# Patient Record
Sex: Male | Born: 1985 | Hispanic: Refuse to answer | Marital: Single | Smoking: Current every day smoker
Health system: Southern US, Community
[De-identification: ages and names within clinical notes are randomized; demographics above are authoritative.]

## PROBLEM LIST (undated history)

## (undated) DIAGNOSIS — F1910 Other psychoactive substance abuse, uncomplicated: Secondary | ICD-10-CM

## (undated) DIAGNOSIS — J45909 Unspecified asthma, uncomplicated: Secondary | ICD-10-CM

## (undated) DIAGNOSIS — K219 Gastro-esophageal reflux disease without esophagitis: Secondary | ICD-10-CM

## (undated) HISTORY — PX: COCCYX REMOVAL: SHX600

## (undated) HISTORY — DX: Other psychoactive substance abuse, uncomplicated: F19.10

## (undated) HISTORY — PX: HIP CLOSED REDUCTION: SHX983

## (undated) HISTORY — PX: JOINT REPLACEMENT: SHX530

## (undated) NOTE — Progress Notes (Signed)
 Formatting of this note is different from the original. Images from the original note were not included.  ____________________________________ [facility] General Surgery  Clinic Note:  Subjective: [name] is a 43 y.o. Caucasian male who underwent an open incisional ventral hernia repair (primary) with Dr. Mellody on 10/27/22.  He states he has been doing very well since surgery. Tolerating a regular diet, no nausea or vomiting. Having regular bowel movements. Incision is healing very well. Denies pain.     Objective: Vitals:   11/24/22 1519  BP: 116/82  Pulse: 84  Temp: 98.4 F (36.9 C)  SpO2: 97%  PainSc: 0-No pain   Gen:  NAD CV:    RRR Resp: CTAB Abd:  S/NT/ND. Supraumbilical incision healing well, glue has peeled off   Labs:  -  Imaging: -  Pathology: -  Assessment: [name] is a 48 y.o. Caucasian male s/p open primary repair of incisional ventral hernia, who is currently doing well.  Plan: 1. Continue current post-op management - doing well, recovering as expected. 2. No strenuous activity or lifting > 20 lbs x 6 weeks post-operatively. Patient verbalized understanding of activity restriction. 3. Patient has requested a letter stating he may do light duty work at [employer] to avoid any heavy lifting for the remaining 5 weeks he is a patient there. 4. The patient should return to clinic prn.  The case to be discussed with Dr. Mellody who will make adjustments to the above assessment and plan as deemed necessary in an addendum.  Jordan Lynn Sampogna, FNP  ____________________________________ 11/25/2022 09:38 Cosigned by Philippe Mellody, MD at 11/25/2022 10:26 PM EDT Electronically signed by Jordan Lynn Sampogna, FNP at 11/25/2022  9:53 AM EDT Electronically signed by Philippe Mellody, MD at 11/25/2022 10:26 PM EDT  Associated attestation - Mellody Philippe, MD - 11/25/2022 10:26 PM EDT Formatting of this note might be  different from the original. Patient care provided under my supervision. I reviewed all pertinent clinical information. I discussed the findings, assessment and plan with the resident physician or APP at the time of documented encounter and agree with the proposed management/treatment plans. My countersignature will demonstrate my concurrence with that documentation.  Philippe Mellody MD GI Surgery

---

## 2006-08-01 ENCOUNTER — Emergency Department: Payer: Self-pay | Admitting: Emergency Medicine

## 2008-02-11 ENCOUNTER — Emergency Department: Payer: Self-pay | Admitting: Emergency Medicine

## 2008-02-12 ENCOUNTER — Other Ambulatory Visit: Payer: Self-pay

## 2008-02-12 ENCOUNTER — Emergency Department: Payer: Self-pay | Admitting: Emergency Medicine

## 2008-05-11 ENCOUNTER — Emergency Department: Payer: Self-pay | Admitting: Emergency Medicine

## 2008-05-13 ENCOUNTER — Ambulatory Visit: Payer: Self-pay | Admitting: Infectious Diseases

## 2008-05-13 ENCOUNTER — Inpatient Hospital Stay (HOSPITAL_COMMUNITY): Admission: EM | Admit: 2008-05-13 | Discharge: 2008-05-15 | Payer: Self-pay | Admitting: Emergency Medicine

## 2008-09-15 ENCOUNTER — Emergency Department: Payer: Self-pay | Admitting: Emergency Medicine

## 2008-10-15 ENCOUNTER — Inpatient Hospital Stay: Payer: Self-pay | Admitting: Psychiatry

## 2008-11-24 ENCOUNTER — Emergency Department: Payer: Self-pay | Admitting: Emergency Medicine

## 2008-12-18 ENCOUNTER — Emergency Department: Payer: Self-pay | Admitting: Emergency Medicine

## 2008-12-19 ENCOUNTER — Emergency Department: Payer: Self-pay | Admitting: Emergency Medicine

## 2009-01-05 ENCOUNTER — Emergency Department: Payer: Self-pay

## 2009-02-21 ENCOUNTER — Emergency Department: Payer: Self-pay | Admitting: Emergency Medicine

## 2009-03-21 ENCOUNTER — Emergency Department: Payer: Self-pay | Admitting: Emergency Medicine

## 2009-11-02 ENCOUNTER — Emergency Department: Payer: Self-pay | Admitting: Internal Medicine
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
## 2010-12-21 NOTE — Discharge Summary (Signed)
[name], [name] NO.:  [id number]   MEDICAL RECORD NO.:  [id number]          PATIENT TYPE:  INP   LOCATION:  1426                         FACILITY:  [facility]   PHYSICIAN:  Hillery Aldo, M.D.   DATE OF BIRTH:  February 11, 1986   DATE OF ADMISSION:  05/13/2008  DATE OF DISCHARGE:  05/15/2008                               DISCHARGE SUMMARY   PRIMARY CARE PHYSICIAN:  Dr. Vonita Moss at [facility] fax  number [phone].   DISCHARGE DIAGNOSES:  1. Carbamazepine provoked hepatic toxicity.  2. Viral illness.  3. Rash secondary to carbamazepine allergy.  4. Hypokalemia.  5. Hypoproteinemia.  6. Hypoalbuminemia.  7. Transient GI bleed.  8. Bipolar disorder.  9. Thrombocytopenia.   DISCHARGE MEDICATIONS:  Benadryl 25 mg q.4 h p.r.n. pruritus.   CONSULTATIONS:  Dr. Ninetta Lights of Infectious Diseases.   BRIEF ADMISSION HISTORY OF PRESENT ILLNESS:  The patient is 25 year old  male who presented to the emergency department on May 12, 2008 with  multiple complaints including a diffuse itchy macular rash preceded by  pharyngitis, nonproductive cough, and difficulty swallowing.  The  patient subsequently developed a headache, fever, abdominal pain and  then had an episode of coffee-ground emesis accompanied by dark-colored  urine.  He also complained of arthralgias and was subsequently evaluated  at the [facility] where he was noted to have a  fever, elevated liver function studies and a normal CBC.  During that  evaluation, a mono spot test and rapid strep test were negative.  Chest  radiography did not show any evidence of pneumonia.  The patient left  against medical advice and subsequently presented to the emergency  department at [facility] for further evaluation.  She was admitted for  further diagnostic workup.  For the full details, please see the  dictated report done by Dr. Susie Cassette.   PROCEDURES AND DIAGNOSTIC STUDIES:  1.  Abdominal ultrasound on May 12, 2008 showed no acute findings or      focal abnormalities.  Spleen and liver measured at the upper limits      of normal for size.  2. CT scan of the abdomen and pelvis on May 13, 2008, showed      hepatic splenomegaly and periportal edema with some periportal      lymph nodes, all compatible with hepatitis.  There was no evidence      of portal or splenic vein thrombosis.  There was a small amount of      free pelvic fluid.   DISCHARGE LABORATORY VALUES:  Sodium was 136, potassium 3.7, chloride  102, bicarb 30, BUN 2, creatinine 0.87, glucose 106, total bilirubin  3.2, alkaline phosphatase 333, AST 175, ALT 297, total protein 4.3,  albumin 2.4.  White blood cell count was 6.6, hemoglobin 12.6,  hematocrit 36.9, platelets 106.   HOSPITAL COURSE:  1. Carbamazepine provoked hepatic toxicity.  The patient was admitted      and diagnostic workup was undertaken.  Because of concern of a      possible infectious etiology, the infectious disease consultants  were also brought on board.  An extensive diagnostic evaluation      including test for Lyme disease, [facility] spotted fever,      hepatitis C virus, cytomegalovirus, rheumatologic screening, and      toxoplasma studies were all negative.  He had evidence of a prior      exposure to Epstein-Barr virus but no immediate viral illness      related to this.  HIV testing is pending at the time of this      dictation.  Nevertheless, the patient's carbamazepine was      discontinued and his liver function studies have trended down over      the course of his hospital stay.  The patient is therefore stable      for discharge and a follow-up appointment has been made with his      primary care physician with directions to have a follow-up liver      function test done to evaluate for further abnormalities.  It      should be noted that the patient should not ever receive      carbamazepine because  of this adverse drug reaction.  2. Questionable viral illness.  The patient did have some symptoms of      a viral illness with fever and pharyngitis.  This has resolved.  3. Rash secondary #1.  The patient's rash is resolving.  He is      instructed to take Benadryl for pruritus.  This is also      attributable to a carbamazepine induced adverse drug reaction      problem.  4. Hypokalemia:  The patient's potassium was appropriately repleted.  5. Hypoproteinemia, hypoalbuminemia.  These were thought to be      secondary to an inflammatory response from an acute drug reaction.      The patient appears to be in good health and has adequate      nutritional stores otherwise.  6. Transient GI bleed.  This was undocumented in terms of having      guaiac testing or proof of a GI bleed based solely on the patient's      description of his vomitus.  Nevertheless, the patient's hemoglobin      and hematocrit have remained stable throughout his hospital stay.      7.  Bipolar disorder.  The patient's mood is depressed, and he will      need a follow-up appointment made with his psychiatrist for      consideration of further treatment.  7. Thrombocytopenia.  Likely due to hepatosplenomegaly.  He should be      monitored for resolution of this issue.   DISPOSITION:  The patient is medically stable for discharge.  A follow-  up appointment has been made with Dr. Dossie Arbour on May 19, 2008 at  8:15 a.m.Hillery Aldo, M.D.  Electronically Signed     CR/MEDQ  D:  05/15/2008  T:  05/15/2008  Job:  [id number]   cc:   Fax #[phone] Dr. Vonita Moss

## 2011-03-22 IMAGING — CR DG LUMBAR SPINE 2-3V
1 series · 4 of 4 positions shown · non-contrast
Comparison: none

REASON FOR EXAM: pt fell 3 days ago, has low back pain
COMMENTS:

[Series 1: view not recorded · 0.17mm/px · 4 of 4 slices shown]
[im 1/4]
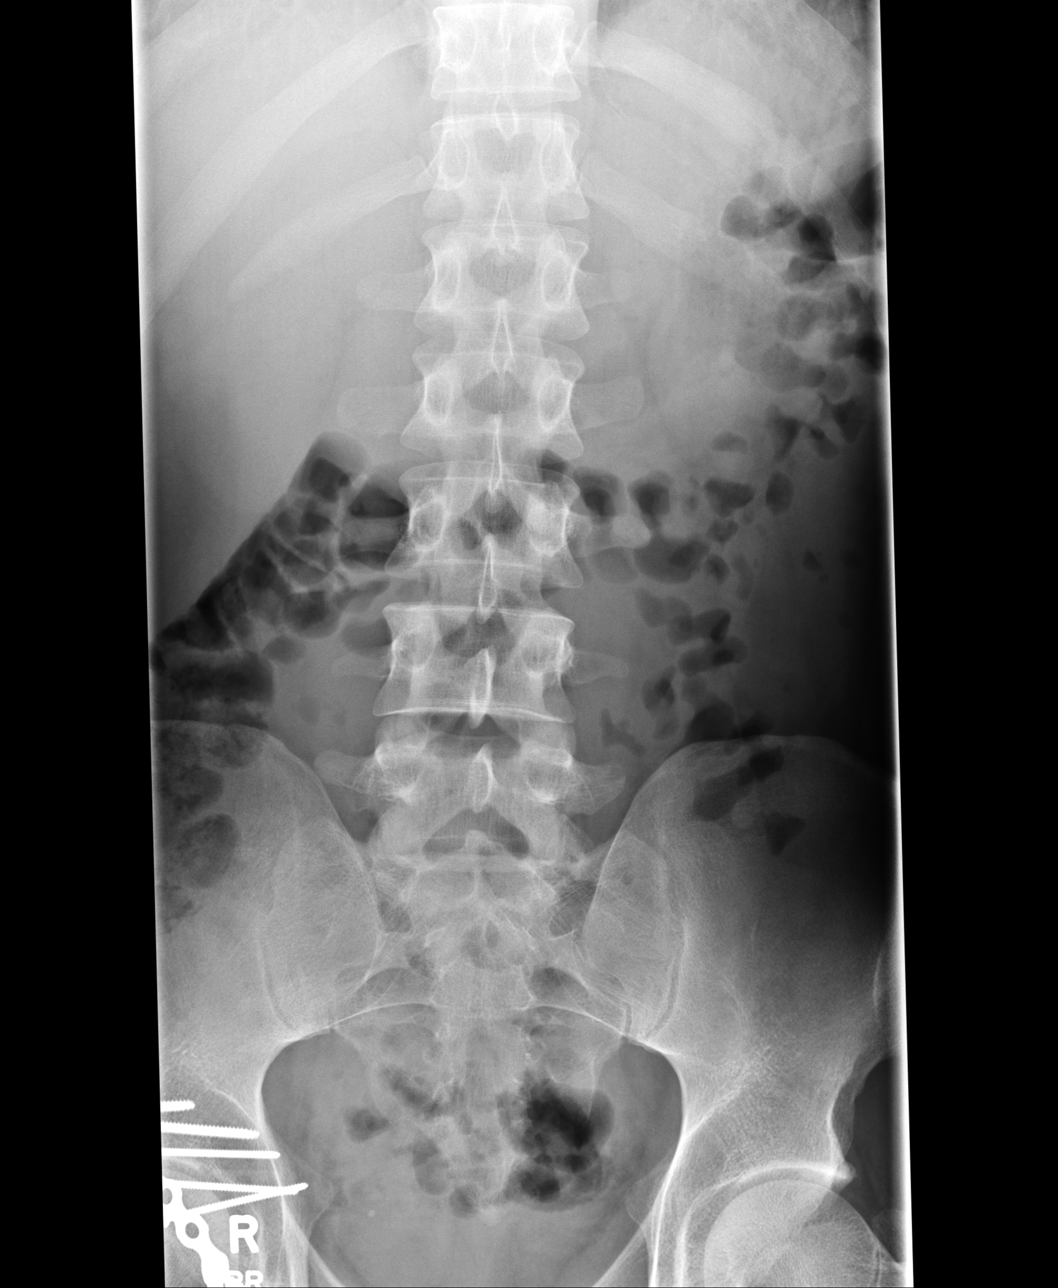
[im 2/4]
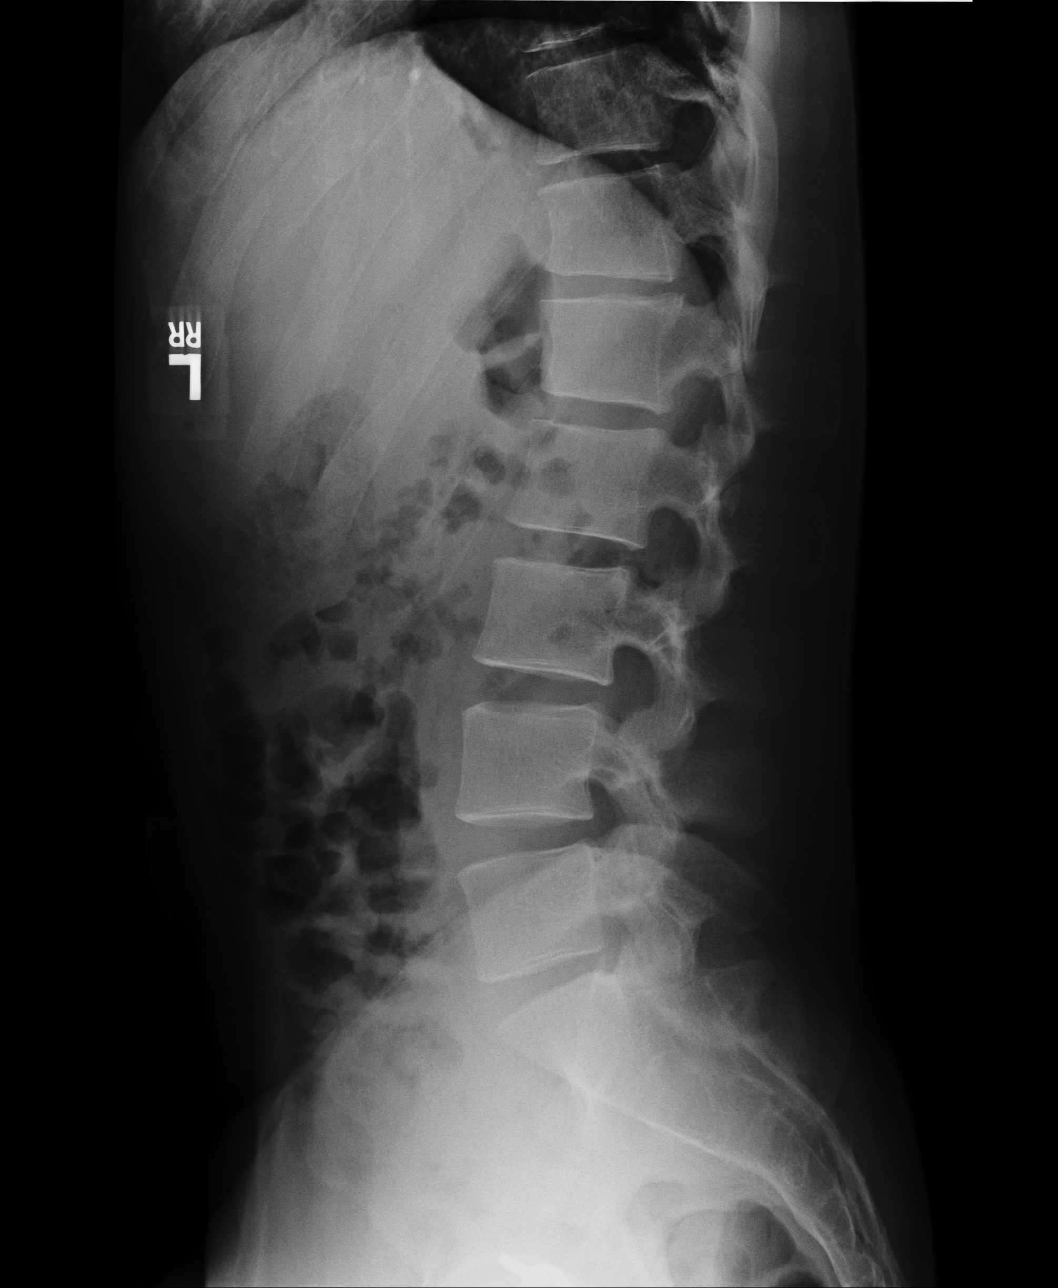
[im 3/4]
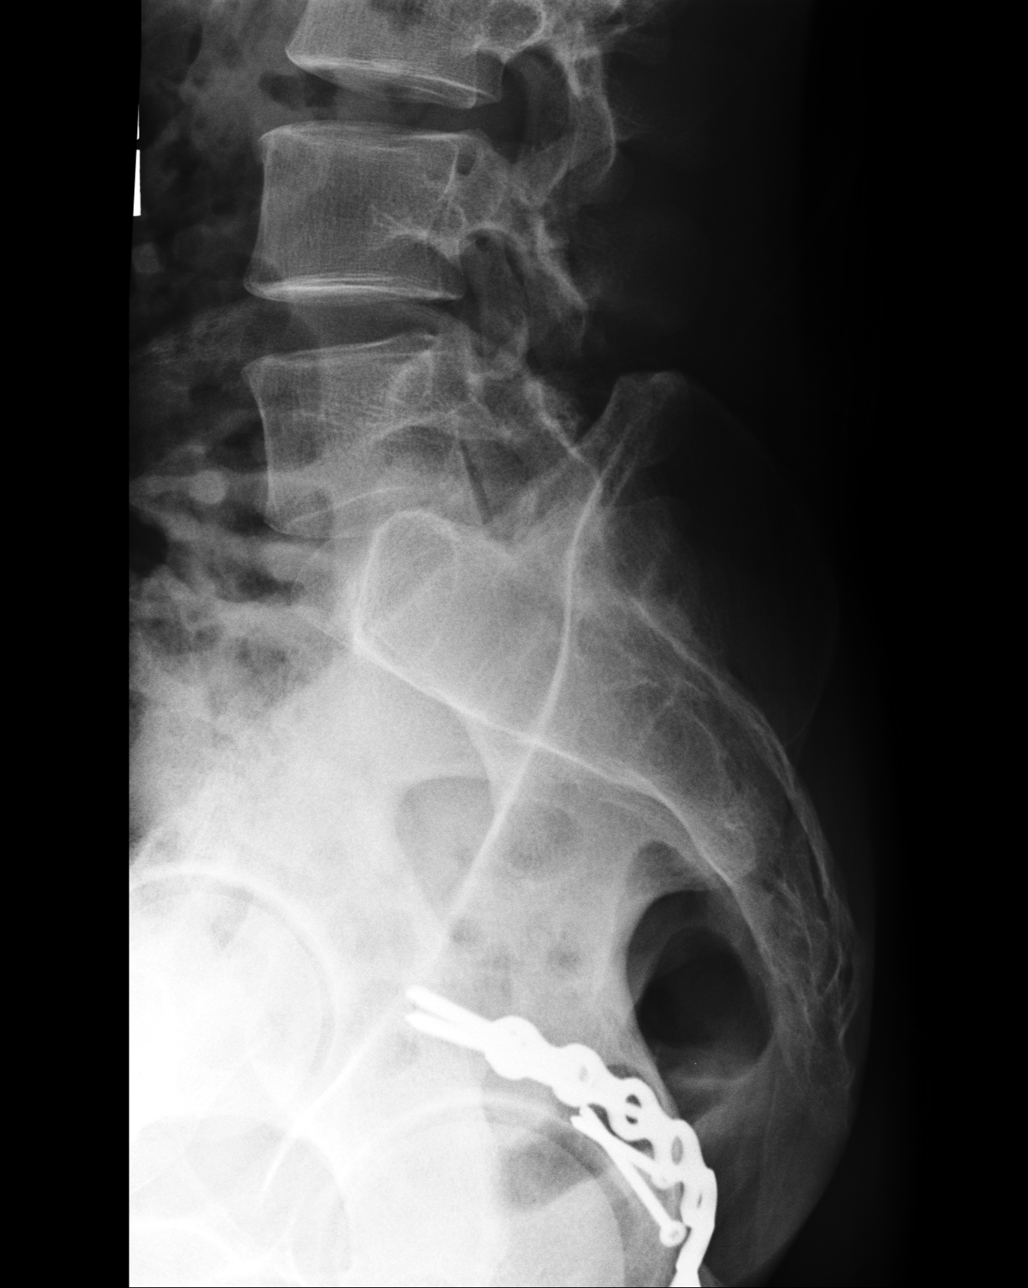
[im 4/4]
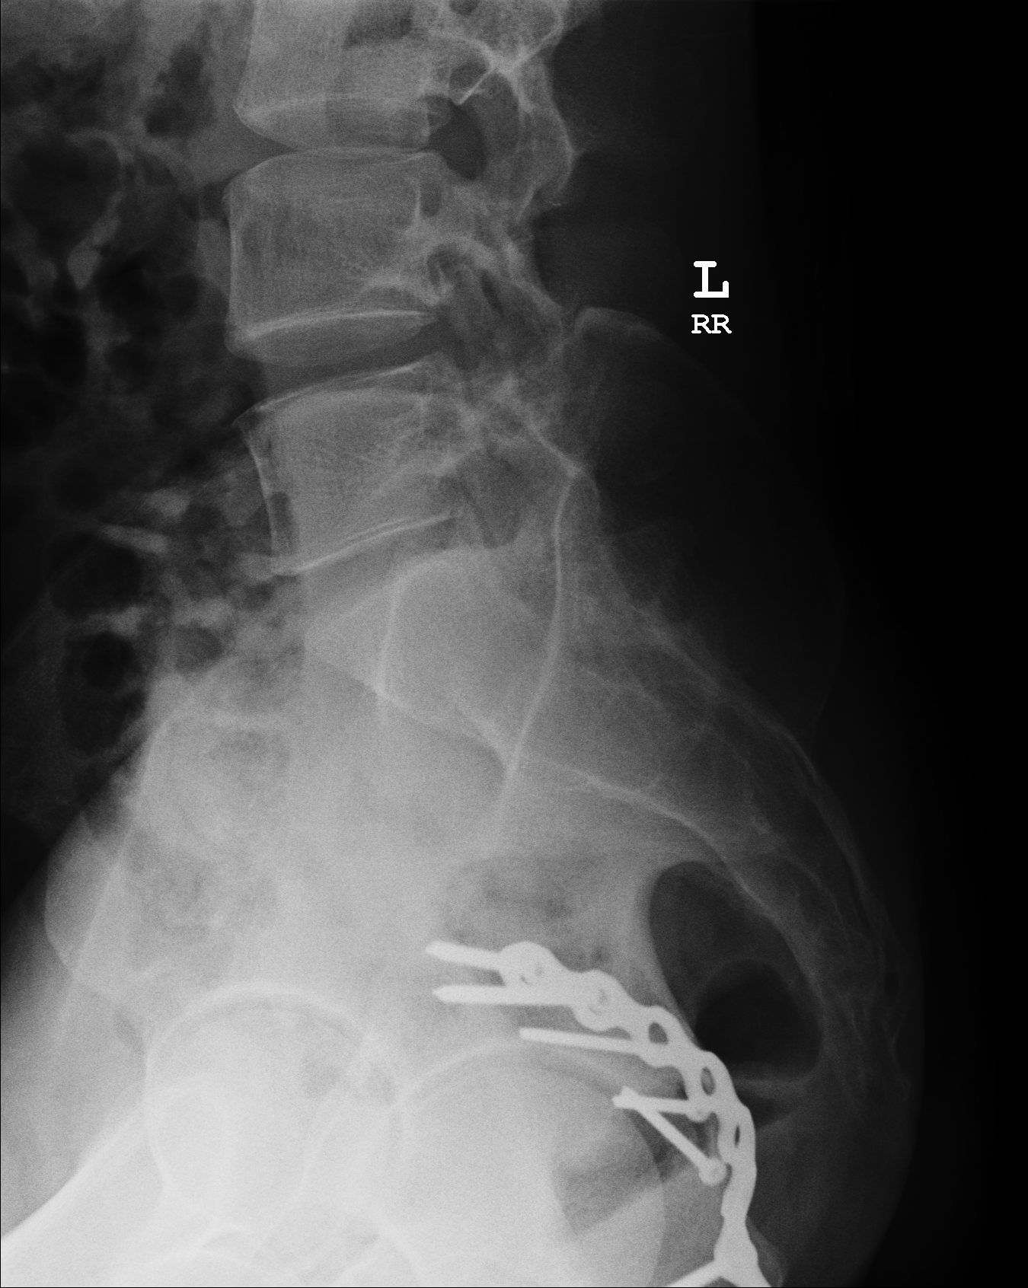

[4 of 4 positions shown; findings below may reference images not displayed]

PROCEDURE:     DXR - DXR LUMBAR SPINE AP AND LATERAL  - February 21, 2009  [DATE]

RESULT:

Five non rib bearing lumbar vertebral bodies are appreciated.  There does
not appear to be evidence of fracture, dislocation or malalignment.  Air is
seen within nondilated loops of bowel.

If there is persistent clinical concern or persistent complaints of pain, a
repeat evaluation in 7-10 days is recommended if clinically warranted.
IMPRESSION: Unremarkable lumbar spine evaluation.

## 2011-05-10 LAB — DIFFERENTIAL
Basophils Absolute: 0.1
Basophils Absolute: 0.1
Basophils Relative: 1
Basophils Relative: 1
Eosinophils Absolute: 0.6
Eosinophils Absolute: 0.6
Eosinophils Relative: 8 — ABNORMAL HIGH
Eosinophils Relative: 8 — ABNORMAL HIGH
Lymphocytes Relative: 42
Lymphocytes Relative: 44
Lymphs Abs: 3.1
Lymphs Abs: 3.5
Monocytes Absolute: 1.1 — ABNORMAL HIGH
Monocytes Absolute: 1.4 — ABNORMAL HIGH
Monocytes Relative: 13 — ABNORMAL HIGH
Monocytes Relative: 18 — ABNORMAL HIGH
Neutro Abs: 2.4
Neutro Abs: 2.8
Neutrophils Relative %: 32 — ABNORMAL LOW
Neutrophils Relative %: 34 — ABNORMAL LOW

## 2011-05-10 LAB — CBC
HCT: 36.9 — ABNORMAL LOW
HCT: 38 — ABNORMAL LOW
HCT: 38.6 — ABNORMAL LOW
HCT: 41
Hemoglobin: 12.6 — ABNORMAL LOW
Hemoglobin: 12.9 — ABNORMAL LOW
Hemoglobin: 13.1
Hemoglobin: 13.8
MCHC: 33.8
MCHC: 33.8
MCHC: 33.9
MCHC: 34.2
MCV: 89.4
MCV: 90.2
MCV: 90.3
MCV: 90.5
Platelets: 106 — ABNORMAL LOW
Platelets: 117 — ABNORMAL LOW
Platelets: 122 — ABNORMAL LOW
Platelets: 141 — ABNORMAL LOW
RBC: 4.13 — ABNORMAL LOW
RBC: 4.21 — ABNORMAL LOW
RBC: 4.27
RBC: 4.54
RDW: 13.4
RDW: 14
RDW: 14
RDW: 14.3
WBC: 6.6
WBC: 6.8
WBC: 7.6
WBC: 8.1

## 2011-05-10 LAB — BASIC METABOLIC PANEL
BUN: 6
CO2: 26
Calcium: 8.1 — ABNORMAL LOW
Chloride: 104
Creatinine, Ser: 0.78
GFR calc Af Amer: >60
GFR calc non Af Amer: >60
Glucose, Bld: 97
Potassium: 3.7
Sodium: 136

## 2011-05-10 LAB — CULTURE, BLOOD (ROUTINE X 2)
Culture: NO GROWTH
Culture: NO GROWTH

## 2011-05-10 LAB — COMPREHENSIVE METABOLIC PANEL
ALT: 297 — ABNORMAL HIGH
ALT: 305 — ABNORMAL HIGH
ALT: 450 — ABNORMAL HIGH
AST: 126 — ABNORMAL HIGH
AST: 175 — ABNORMAL HIGH
AST: 186 — ABNORMAL HIGH
Albumin: 2.4 — ABNORMAL LOW
Albumin: 2.4 — ABNORMAL LOW
Albumin: 3.1 — ABNORMAL LOW
Alkaline Phosphatase: 229 — ABNORMAL HIGH
Alkaline Phosphatase: 241 — ABNORMAL HIGH
Alkaline Phosphatase: 333 — ABNORMAL HIGH
BUN: 2 — ABNORMAL LOW
BUN: 3 — ABNORMAL LOW
BUN: 8
CO2: 27
CO2: 29
CO2: 30
Calcium: 8 — ABNORMAL LOW
Calcium: 8 — ABNORMAL LOW
Calcium: 8.7
Chloride: 100
Chloride: 102
Chloride: 103
Creatinine, Ser: 0.78
Creatinine, Ser: 0.87
Creatinine, Ser: 0.87
GFR calc Af Amer: >60
GFR calc Af Amer: >60
GFR calc Af Amer: >60
GFR calc non Af Amer: >60
GFR calc non Af Amer: >60
GFR calc non Af Amer: >60
Glucose, Bld: 101 — ABNORMAL HIGH
Glucose, Bld: 106 — ABNORMAL HIGH
Glucose, Bld: 80
Potassium: 3.4 — ABNORMAL LOW
Potassium: 3.6
Potassium: 3.7
Sodium: 136
Sodium: 136
Sodium: 137
Total Bilirubin: 3.2 — ABNORMAL HIGH
Total Bilirubin: 3.3 — ABNORMAL HIGH
Total Bilirubin: 4.9 — ABNORMAL HIGH
Total Protein: 4.3 — ABNORMAL LOW
Total Protein: 4.3 — ABNORMAL LOW
Total Protein: 5.3 — ABNORMAL LOW

## 2011-05-10 LAB — URINALYSIS, ROUTINE W REFLEX MICROSCOPIC
Glucose, UA: NEGATIVE
Hgb urine dipstick: NEGATIVE
Ketones, ur: 15 — AB
Nitrite: POSITIVE — AB
Protein, ur: 30 — AB
Specific Gravity, Urine: 1.033 — ABNORMAL HIGH
Urobilinogen, UA: 1
pH: 7.5

## 2011-05-10 LAB — RAPID URINE DRUG SCREEN, HOSP PERFORMED
Amphetamines: NOT DETECTED
Barbiturates: NOT DETECTED
Benzodiazepines: POSITIVE — AB
Cocaine: NOT DETECTED
Opiates: NOT DETECTED
Tetrahydrocannabinol: NOT DETECTED

## 2011-05-10 LAB — HIV 1/2 CONFIRMATION
HIV-1 antibody: NEGATIVE
HIV-2 Ab: NEGATIVE

## 2011-05-10 LAB — PROTIME-INR
INR: 1.1
INR: 1.2
Prothrombin Time: 14.1
Prothrombin Time: 15.1

## 2011-05-10 LAB — MONONUCLEOSIS SCREEN: Mono Screen: NEGATIVE

## 2011-05-10 LAB — HIV-1 RNA, QUALITATIVE, TMA: HIV-1 RNA, Qualitative, TMA: NOT DETECTED

## 2011-05-10 LAB — URINE MICROSCOPIC-ADD ON

## 2011-05-10 LAB — C-REACTIVE PROTEIN: CRP: 2.2 — ABNORMAL HIGH (ref <0.6)

## 2011-05-10 LAB — ANA: Anti Nuclear Antibody(ANA): NEGATIVE

## 2011-05-10 LAB — HCV RNA QUANT

## 2011-05-10 LAB — CYTOMEGALOVIRUS ANTIBODY, IGG: Cytomegalovirus(CMV) Antibody, IgG: NEGATIVE

## 2011-05-10 LAB — CMV ABS, IGG+IGM (CYTOMEGALOVIRUS)
CMV IgM: <8 AU/mL (ref <30.0)
Cytomegalovirus Ab-IgG: <0.2 U/mL (ref <0.6)

## 2011-05-10 LAB — ROCKY MTN SPOTTED FVR AB, IGM-BLOOD: RMSF IgM: 0.19 IV

## 2011-05-10 LAB — TYPE AND SCREEN
ABO/RH(D): O NEG
Antibody Screen: NEGATIVE

## 2011-05-10 LAB — EPSTEIN-BARR VIRUS VCA ANTIBODY PANEL
EBV EA IgG: 0.14
EBV NA IgG: 5.16 — ABNORMAL HIGH
EBV VCA IgG: 5.3 — ABNORMAL HIGH
EBV VCA IgM: 0.03

## 2011-05-10 LAB — TOXOPLASMA ANTIBODIES- IGG AND  IGM
Toxoplasma Antibody- IgM: 0.18 IV
Toxoplasma IgG Ratio: <0.5 IU/mL

## 2011-05-10 LAB — ETHANOL: Alcohol, Ethyl (B): <5

## 2011-05-10 LAB — OCCULT BLOOD X 1 CARD TO LAB, STOOL: Fecal Occult Bld: POSITIVE

## 2011-05-10 LAB — APTT: aPTT: 33

## 2011-05-10 LAB — HEPATITIS A ANTIBODY, IGM: Hep A IgM: NEGATIVE

## 2011-05-10 LAB — CARBAMAZEPINE LEVEL, TOTAL: Carbamazepine Lvl: <2 — ABNORMAL LOW

## 2011-05-10 LAB — HEPATITIS B SURFACE ANTIBODY,QUALITATIVE: Hep B S Ab: NEGATIVE

## 2011-05-10 LAB — RPR: RPR Ser Ql: NONREACTIVE

## 2011-05-10 LAB — ACETAMINOPHEN LEVEL: Acetaminophen (Tylenol), Serum: <10 — ABNORMAL LOW

## 2011-05-10 LAB — LIPASE, BLOOD: Lipase: 15

## 2011-05-10 LAB — SEDIMENTATION RATE: Sed Rate: 2

## 2011-05-10 LAB — RAPID STREP SCREEN (MED CTR MEBANE ONLY): Streptococcus, Group A Screen (Direct): NEGATIVE

## 2011-05-10 LAB — B. BURGDORFI ANTIBODIES: B burgdorferi Ab IgG+IgM: 0.04

## 2011-05-10 LAB — HEPATITIS B SURFACE ANTIGEN: Hepatitis B Surface Ag: NEGATIVE

## 2011-05-10 LAB — HSV(HERPES SIMPLEX VRS) I + II AB-IGM: Herpes Simplex Vrs I&II-IgM Ab (EIA): NOT DETECTED

## 2011-05-10 LAB — ABO/RH: ABO/RH(D): O NEG

## 2012-03-19 ENCOUNTER — Emergency Department: Payer: Self-pay | Admitting: *Deleted

## 2012-03-19 LAB — URINALYSIS, COMPLETE
Bacteria: NONE SEEN
Bilirubin,UR: NEGATIVE
Glucose,UR: NEGATIVE mg/dL (ref 0–75)
Leukocyte Esterase: NEGATIVE
Nitrite: NEGATIVE
Ph: 6 (ref 4.5–8.0)
Protein: 30
RBC,UR: 5 /HPF (ref 0–5)
Specific Gravity: 1.027 (ref 1.003–1.030)
Squamous Epithelial: <1
WBC UR: 5 /HPF (ref 0–5)

## 2012-03-19 LAB — CBC
HCT: 44.7 % (ref 40.0–52.0)
HGB: 15.8 g/dL (ref 13.0–18.0)
MCH: 33.3 pg (ref 26.0–34.0)
MCHC: 35.4 g/dL (ref 32.0–36.0)
MCV: 94 fL (ref 80–100)
Platelet: 154 10*3/uL (ref 150–440)
RBC: 4.76 10*6/uL (ref 4.40–5.90)
RDW: 13.5 % (ref 11.5–14.5)
WBC: 8.9 10*3/uL (ref 3.8–10.6)

## 2012-03-19 LAB — COMPREHENSIVE METABOLIC PANEL
Albumin: 3.5 g/dL (ref 3.4–5.0)
Alkaline Phosphatase: 60 U/L (ref 50–136)
Anion Gap: 9 (ref 7–16)
BUN: 11 mg/dL (ref 7–18)
Bilirubin,Total: 0.4 mg/dL (ref 0.2–1.0)
Calcium, Total: 9.1 mg/dL (ref 8.5–10.1)
Chloride: 100 mmol/L (ref 98–107)
Co2: 28 mmol/L (ref 21–32)
Creatinine: 1.02 mg/dL (ref 0.60–1.30)
EGFR (African American): >60
EGFR (Non-African Amer.): >60
Glucose: 85 mg/dL (ref 65–99)
Osmolality: 272 (ref 275–301)
Potassium: 3.3 mmol/L — ABNORMAL LOW (ref 3.5–5.1)
SGOT(AST): 24 U/L (ref 15–37)
SGPT (ALT): 23 U/L (ref 12–78)
Sodium: 137 mmol/L (ref 136–145)
Total Protein: 7.3 g/dL (ref 6.4–8.2)

## 2012-03-19 LAB — LIPASE, BLOOD: Lipase: 68 U/L — ABNORMAL LOW (ref 73–393)

## 2014-02-04 ENCOUNTER — Encounter: Payer: Self-pay | Admitting: Internal Medicine

## 2014-02-05 ENCOUNTER — Encounter: Payer: Self-pay | Admitting: Internal Medicine

## 2014-04-06 ENCOUNTER — Emergency Department: Payer: Self-pay | Admitting: Emergency Medicine

## 2014-04-06 LAB — CBC WITH DIFFERENTIAL/PLATELET
Basophil #: 0.1 10*3/uL (ref 0.0–0.1)
Basophil %: 1.3 %
Eosinophil #: 0.7 10*3/uL (ref 0.0–0.7)
Eosinophil %: 9.2 %
HCT: 50 % (ref 40.0–52.0)
HGB: 16.3 g/dL (ref 13.0–18.0)
Lymphocyte #: 1.7 10*3/uL (ref 1.0–3.6)
Lymphocyte %: 22.5 %
MCH: 31.5 pg (ref 26.0–34.0)
MCHC: 32.6 g/dL (ref 32.0–36.0)
MCV: 97 fL (ref 80–100)
Monocyte #: 0.9 x10 3/mm (ref 0.2–1.0)
Monocyte %: 12.4 %
Neutrophil #: 4.2 10*3/uL (ref 1.4–6.5)
Neutrophil %: 54.6 %
Platelet: 245 10*3/uL (ref 150–440)
RBC: 5.18 10*6/uL (ref 4.40–5.90)
RDW: 13.5 % (ref 11.5–14.5)
WBC: 7.7 10*3/uL (ref 3.8–10.6)

## 2014-04-06 LAB — COMPREHENSIVE METABOLIC PANEL
Albumin: 4.2 g/dL (ref 3.4–5.0)
Alkaline Phosphatase: 65 U/L
Anion Gap: 9 (ref 7–16)
BUN: 9 mg/dL (ref 7–18)
Bilirubin,Total: 0.5 mg/dL (ref 0.2–1.0)
Calcium, Total: 8.7 mg/dL (ref 8.5–10.1)
Chloride: 104 mmol/L (ref 98–107)
Co2: 27 mmol/L (ref 21–32)
Creatinine: 0.96 mg/dL (ref 0.60–1.30)
EGFR (African American): >60
EGFR (Non-African Amer.): >60
Glucose: 89 mg/dL (ref 65–99)
Osmolality: 278 (ref 275–301)
Potassium: 4.1 mmol/L (ref 3.5–5.1)
SGOT(AST): 15 U/L (ref 15–37)
SGPT (ALT): 25 U/L
Sodium: 140 mmol/L (ref 136–145)
Total Protein: 7.2 g/dL (ref 6.4–8.2)

## 2014-09-19 ENCOUNTER — Emergency Department: Payer: Self-pay | Admitting: Emergency Medicine

## 2019-06-16 ENCOUNTER — Encounter: Payer: Self-pay | Admitting: Emergency Medicine

## 2019-06-16 ENCOUNTER — Ambulatory Visit
Admission: EM | Admit: 2019-06-16 | Discharge: 2019-06-17 | Disposition: A | Discharge status: Home / Self Care | Payer: Medicaid Other | Attending: Surgery | Admitting: Surgery

## 2019-06-16 ENCOUNTER — Other Ambulatory Visit: Payer: Self-pay

## 2019-06-16 ENCOUNTER — Emergency Department: Payer: Medicaid Other

## 2019-06-16 DIAGNOSIS — Z79899 Other long term (current) drug therapy: Secondary | ICD-10-CM | POA: Diagnosis not present

## 2019-06-16 DIAGNOSIS — Z20828 Contact with and (suspected) exposure to other viral communicable diseases: Secondary | ICD-10-CM | POA: Insufficient documentation

## 2019-06-16 DIAGNOSIS — S73004A Unspecified dislocation of right hip, initial encounter: Secondary | ICD-10-CM

## 2019-06-16 DIAGNOSIS — J45909 Unspecified asthma, uncomplicated: Secondary | ICD-10-CM | POA: Insufficient documentation

## 2019-06-16 DIAGNOSIS — F17200 Nicotine dependence, unspecified, uncomplicated: Secondary | ICD-10-CM | POA: Diagnosis not present

## 2019-06-16 DIAGNOSIS — T84020A Dislocation of internal right hip prosthesis, initial encounter: Secondary | ICD-10-CM | POA: Insufficient documentation

## 2019-06-16 DIAGNOSIS — X58XXXA Exposure to other specified factors, initial encounter: Secondary | ICD-10-CM | POA: Diagnosis not present

## 2019-06-16 HISTORY — DX: Unspecified asthma, uncomplicated: J45.909

## 2019-06-16 MED ORDER — ETOMIDATE 2 MG/ML IV SOLN
10.0000 mg | Freq: Once | INTRAVENOUS | Status: AC
  Administered 2019-06-16: 10 mg via INTRAVENOUS

## 2019-06-16 MED ORDER — HYDROMORPHONE HCL 1 MG/ML IJ SOLN
1.0000 mg | Freq: Once | INTRAMUSCULAR | Status: AC
  Administered 2019-06-16: 1 mg via INTRAVENOUS
  Filled 2019-06-16: qty 1

## 2019-06-16 MED ORDER — LORAZEPAM 2 MG/ML IJ SOLN
2.0000 mg | Freq: Once | INTRAMUSCULAR | Status: AC
  Administered 2019-06-16: 2 mg via INTRAVENOUS
  Filled 2019-06-16: qty 1

## 2019-06-16 MED ORDER — ETOMIDATE 2 MG/ML IV SOLN
20.0000 mg | Freq: Once | INTRAVENOUS | Status: AC
  Administered 2019-06-16: 10 mg via INTRAVENOUS
  Filled 2019-06-16: qty 10

## 2019-06-16 NOTE — ED Notes (Signed)
Dr Joni Fears and Roderic Palau, PA at bedside to attempt to relocate pt's hip

## 2019-06-16 NOTE — ED Notes (Signed)
In with PA and MD to give meds for conscious sedation; IV not flushing; will start new IV for procedure

## 2019-06-16 NOTE — ED Notes (Signed)
Pt awakened after calling name and shaking him on the right arm; pt groggy; understands procedure did not work and the plan is to go to the OR in the am; pt requesting I call his father with details
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
## 2019-06-16 NOTE — ED Notes (Signed)
Pt back from x-ray.

## 2019-06-16 NOTE — ED Notes (Signed)
Unable to relocate pt's hip

## 2019-06-16 NOTE — ED Notes (Signed)
Pt incontinent of urine.  

## 2019-06-16 NOTE — ED Provider Notes (Signed)
.  Sedation  Date/Time: 06/16/2019 10:29 PM Performed by: Carrie Mew, MD Authorized by: Carrie Mew, MD   Consent:    Consent obtained:  Written   Consent given by:  Patient   Risks discussed:  Prolonged hypoxia resulting in organ damage, respiratory compromise necessitating ventilatory assistance and intubation, vomiting and inadequate sedation   Alternatives discussed:  Anxiolysis and analgesia without sedation Universal protocol:    Procedure explained and questions answered to patient or proxy's satisfaction: yes     Test results available and properly labeled: yes     Imaging studies available: yes     Required blood products, implants, devices, and special equipment available: yes     Immediately prior to procedure a time out was called: yes     Patient identity confirmation method:  Arm band and verbally with patient Indications:    Procedure performed:  Dislocation reduction   Procedure necessitating sedation performed by:  Physician performing sedation (physician assistant and physician together) Pre-sedation assessment:    Time since last food or drink:  5 hours   ASA classification: class 1 - normal, healthy patient     Neck mobility: normal     Mouth opening:  3 or more finger widths   Thyromental distance:  4 finger widths   Mallampati score:  I - soft palate, uvula, fauces, pillars visible   Pre-sedation assessments completed and reviewed: airway patency, cardiovascular function, hydration status, mental status, nausea/vomiting, pain level and respiratory function     Pre-sedation assessment completed:  06/16/2019 9:30 PM Immediate pre-procedure details:    Reassessment: Patient reassessed immediately prior to procedure     Reviewed: vital signs and relevant labs/tests     Verified: bag valve mask available, emergency equipment available, intubation equipment available, IV patency confirmed, oxygen available and suction available   Procedure details (see MAR  for exact dosages):    Preoxygenation:  Nasal cannula   Sedation:  Etomidate (and ativan)   Intended level of sedation: deep   Analgesia:  Hydromorphone   Intra-procedure monitoring:  Blood pressure monitoring, cardiac monitor, continuous capnometry, continuous pulse oximetry, frequent LOC assessments and frequent vital sign checks   Intra-procedure events: none     Total Provider sedation time (minutes):  25 Post-procedure details:    Post-sedation assessment completed:  06/16/2019 10:32 PM   Attendance: Constant attendance by certified staff until patient recovered     Recovery: Patient returned to pre-procedure baseline     Post-sedation assessments completed and reviewed: airway patency, cardiovascular function, mental status, nausea/vomiting, pain level and respiratory function     Patient is stable for discharge or admission: yes     Patient tolerance:  Tolerated well, no immediate complications           Carrie Mew, MD 06/16/19 2233
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
## 2019-06-16 NOTE — ED Notes (Signed)
Called pt's father to inform him of the outcome of the procedure and plan of care for the rest of the night and morning; (permission given by patient

## 2019-06-16 NOTE — ED Notes (Signed)
Oxygen via [place] removed;

## 2019-06-17 ENCOUNTER — Emergency Department: Payer: Medicaid Other

## 2019-06-17 ENCOUNTER — Encounter
Admission: EM | Disposition: A | Discharge status: Home / Self Care | Payer: Self-pay | Source: Home / Self Care | Attending: Emergency Medicine

## 2019-06-17 ENCOUNTER — Emergency Department: Payer: Medicaid Other | Admitting: Anesthesiology

## 2019-06-17 ENCOUNTER — Encounter: Payer: Self-pay | Admitting: Anesthesiology

## 2019-06-17 HISTORY — PX: HIP CLOSED REDUCTION: SHX983

## 2019-06-17 LAB — CBC WITH DIFFERENTIAL/PLATELET
Abs Immature Granulocytes: 0.03 10*3/uL (ref 0.00–0.07)
Basophils Absolute: 0.1 10*3/uL (ref 0.0–0.1)
Basophils Relative: 1 %
Eosinophils Absolute: 0.1 10*3/uL (ref 0.0–0.5)
Eosinophils Relative: 1 %
HCT: 40.7 % (ref 39.0–52.0)
Hemoglobin: 14 g/dL (ref 13.0–17.0)
Immature Granulocytes: 0 %
Lymphocytes Relative: 15 %
Lymphs Abs: 1.2 10*3/uL (ref 0.7–4.0)
MCH: 30.6 pg (ref 26.0–34.0)
MCHC: 34.4 g/dL (ref 30.0–36.0)
MCV: 89.1 fL (ref 80.0–100.0)
Monocytes Absolute: 0.4 10*3/uL (ref 0.1–1.0)
Monocytes Relative: 5 %
Neutro Abs: 6.5 10*3/uL (ref 1.7–7.7)
Neutrophils Relative %: 78 %
Platelets: 201 10*3/uL (ref 150–400)
RBC: 4.57 MIL/uL (ref 4.22–5.81)
RDW: 13.4 % (ref 11.5–15.5)
WBC: 8.4 10*3/uL (ref 4.0–10.5)
nRBC: 0 % (ref 0.0–0.2)

## 2019-06-17 LAB — BASIC METABOLIC PANEL WITH GFR
Anion gap: 9 (ref 5–15)
BUN: 9 mg/dL (ref 6–20)
CO2: 23 mmol/L (ref 22–32)
Calcium: 8.8 mg/dL — ABNORMAL LOW (ref 8.9–10.3)
Chloride: 105 mmol/L (ref 98–111)
Creatinine, Ser: 0.64 mg/dL (ref 0.61–1.24)
GFR calc Af Amer: >60 mL/min
GFR calc non Af Amer: >60 mL/min
Glucose, Bld: 104 mg/dL — ABNORMAL HIGH (ref 70–99)
Potassium: 3.8 mmol/L (ref 3.5–5.1)
Sodium: 137 mmol/L (ref 135–145)

## 2019-06-17 LAB — SARS CORONAVIRUS 2 BY RT PCR (HOSPITAL ORDER, PERFORMED IN ~~LOC~~ HOSPITAL LAB): SARS Coronavirus 2: NEGATIVE

## 2019-06-17 SURGERY — CLOSED REDUCTION, HIP
Anesthesia: General | Site: Hip

## 2019-06-17 MED ORDER — MIDAZOLAM HCL 2 MG/2ML IJ SOLN
INTRAMUSCULAR | Status: AC
  Filled 2019-06-17: qty 2

## 2019-06-17 MED ORDER — ONDANSETRON HCL 4 MG/2ML IJ SOLN: 4.0000 mg | Freq: Four times a day (QID) | INTRAMUSCULAR | Status: DC | PRN

## 2019-06-17 MED ORDER — LACTATED RINGERS IV SOLN
INTRAVENOUS | Status: DC | PRN
  Administered 2019-06-17: 06:00:00 via INTRAVENOUS

## 2019-06-17 MED ORDER — METOCLOPRAMIDE HCL 10 MG PO TABS: 5.0000 mg | ORAL_TABLET | Freq: Three times a day (TID) | ORAL | Status: DC | PRN

## 2019-06-17 MED ORDER — FENTANYL CITRATE (PF) 100 MCG/2ML IJ SOLN
INTRAMUSCULAR | Status: DC | PRN
  Administered 2019-06-17: 100 ug via INTRAVENOUS

## 2019-06-17 MED ORDER — FLUMAZENIL 0.5 MG/5ML IV SOLN
INTRAVENOUS | Status: DC | PRN
  Administered 2019-06-17: 0.2 mg via INTRAVENOUS

## 2019-06-17 MED ORDER — METOCLOPRAMIDE HCL 5 MG/ML IJ SOLN: 5.0000 mg | Freq: Three times a day (TID) | INTRAMUSCULAR | Status: DC | PRN

## 2019-06-17 MED ORDER — FENTANYL CITRATE (PF) 100 MCG/2ML IJ SOLN
25.0000 ug | INTRAMUSCULAR | Status: DC | PRN
  Administered 2019-06-17 (×2): 25 ug via INTRAVENOUS

## 2019-06-17 MED ORDER — FENTANYL CITRATE (PF) 100 MCG/2ML IJ SOLN
INTRAMUSCULAR | Status: AC
  Administered 2019-06-17: 25 ug via INTRAVENOUS
  Filled 2019-06-17: qty 2

## 2019-06-17 MED ORDER — POTASSIUM CHLORIDE IN NACL 20-0.9 MEQ/L-% IV SOLN
INTRAVENOUS | Status: DC
  Filled 2019-06-17: qty 1000

## 2019-06-17 MED ORDER — TRAMADOL HCL 50 MG PO TABS: 50.0000 mg | ORAL_TABLET | Freq: Four times a day (QID) | ORAL | 0 refills | Status: DC | PRN

## 2019-06-17 MED ORDER — LIDOCAINE HCL (CARDIAC) PF 100 MG/5ML IV SOSY
PREFILLED_SYRINGE | INTRAVENOUS | Status: DC | PRN
  Administered 2019-06-17: 100 mg via INTRAVENOUS

## 2019-06-17 MED ORDER — HYDROCODONE-ACETAMINOPHEN 5-325 MG PO TABS: 1.0000 | ORAL_TABLET | ORAL | Status: DC | PRN

## 2019-06-17 MED ORDER — ONDANSETRON HCL 4 MG/2ML IJ SOLN
INTRAMUSCULAR | Status: DC | PRN
  Administered 2019-06-17: 4 mg via INTRAVENOUS

## 2019-06-17 MED ORDER — SEVOFLURANE IN SOLN
RESPIRATORY_TRACT | Status: AC
  Filled 2019-06-17: qty 250

## 2019-06-17 MED ORDER — MIDAZOLAM HCL 2 MG/2ML IJ SOLN
INTRAMUSCULAR | Status: DC | PRN
  Administered 2019-06-17: 2 mg via INTRAVENOUS

## 2019-06-17 MED ORDER — TRAMADOL HCL 50 MG PO TABS
50.0000 mg | ORAL_TABLET | Freq: Four times a day (QID) | ORAL | Status: DC | PRN
  Filled 2019-06-17: qty 1

## 2019-06-17 MED ORDER — HYDROCODONE-ACETAMINOPHEN 5-325 MG PO TABS: 1.0000 | ORAL_TABLET | Freq: Four times a day (QID) | ORAL | 0 refills | Status: DC | PRN

## 2019-06-17 MED ORDER — FENTANYL CITRATE (PF) 250 MCG/5ML IJ SOLN
INTRAMUSCULAR | Status: AC
  Filled 2019-06-17: qty 5

## 2019-06-17 MED ORDER — ONDANSETRON HCL 4 MG/2ML IJ SOLN: 4.0000 mg | Freq: Once | INTRAMUSCULAR | Status: DC | PRN

## 2019-06-17 MED ORDER — ONDANSETRON HCL 4 MG PO TABS: 4.0000 mg | ORAL_TABLET | Freq: Four times a day (QID) | ORAL | Status: DC | PRN

## 2019-06-17 MED ORDER — PROPOFOL 10 MG/ML IV BOLUS
INTRAVENOUS | Status: DC | PRN
  Administered 2019-06-17: 140 mg via INTRAVENOUS

## 2019-06-17 SURGICAL SUPPLY — 8 items
COVER WAND RF STERILE (DRAPES) ×2 IMPLANT
GLOVE BIO SURGEON STRL SZ8 (GLOVE) ×4 IMPLANT
GOWN STRL REUS W/ TWL LRG LVL3 (GOWN DISPOSABLE) ×1 IMPLANT
GOWN STRL REUS W/ TWL XL LVL3 (GOWN DISPOSABLE) ×1 IMPLANT
GOWN STRL REUS W/TWL LRG LVL3 (GOWN DISPOSABLE) ×1
GOWN STRL REUS W/TWL XL LVL3 (GOWN DISPOSABLE) ×1
KIT TURNOVER KIT A (KITS) ×2 IMPLANT
PACK HIP PROSTHESIS (MISCELLANEOUS) ×2 IMPLANT

## 2019-06-17 NOTE — ED Notes (Addendum)
Report to Wells Guiles; moving pt to CPOD 34

## 2019-06-17 NOTE — Anesthesia Procedure Notes (Signed)
Procedure Name: Intubation Date/Time: 06/17/2019 6:34 AM Performed by: Justus Memory, CRNA Pre-anesthesia Checklist: Patient identified, Patient being monitored, Timeout performed, Emergency Drugs available and Suction available Patient Re-evaluated:Patient Re-evaluated prior to induction Oxygen Delivery Method: Circle system utilized Preoxygenation: Pre-oxygenation with 100% oxygen Induction Type: IV induction Ventilation: Mask ventilation without difficulty Laryngoscope Size: 3 and McGraph Grade View: Grade I Tube type: Oral Tube size: 7.0 mm Number of attempts: 1 Airway Equipment and Method: Stylet,  Video-laryngoscopy and Patient positioned with wedge pillow Placement Confirmation: ETT inserted through vocal cords under direct vision,  positive ETCO2 and breath sounds checked- equal and bilateral Secured at: 21 cm Tube secured with: Tape Dental Injury: Teeth and Oropharynx as per pre-operative assessment
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
## 2019-06-17 NOTE — Anesthesia Post-op Follow-up Note (Signed)
Anesthesia QCDR form completed.        

## 2019-06-17 NOTE — Transfer of Care (Signed)
Immediate Anesthesia Transfer of Care Note  Patient: [name]  Procedure(s) Performed: CLOSED REDUCTION HIP (N/A Hip)  Patient Location: PACU  Anesthesia Type:General  Level of Consciousness: sedated  Airway & Oxygen Therapy: Patient Spontanous Breathing and Patient connected to face mask oxygen  Post-op Assessment: Report given to RN and Post -op Vital signs reviewed and stable  Post vital signs: Reviewed and stable  Last Vitals:  Vitals Value Taken Time  BP 136/93 06/17/19 0700  Temp 36.7 C 06/17/19 0700  Pulse 70 06/17/19 0706  Resp 14 06/17/19 0706  SpO2 100 % 06/17/19 0706  Vitals shown include unvalidated device data.  Last Pain:  Vitals:   06/17/19 0700  TempSrc:   PainSc: Asleep         Complications: No apparent anesthesia complications

## 2019-06-17 NOTE — Discharge Instructions (Addendum)
Orthopedic discharge instructions: May shower as of Tuesday.  Apply ice frequently to hip. Take ibuprofen 600-800 mg TID with meals as necessary. Take pain medication as prescribed or ES Tylenol when needed.  May weight-bear as tolerated - use crutches or walker as needed. Follow-up in 3 to 4 weeks.  AMBULATORY SURGERY  DISCHARGE INSTRUCTIONS   1) The drugs that you were given will stay in your system until tomorrow so for the next 24 hours you should not:  A) Drive an automobile B) Make any legal decisions C) Drink any alcoholic beverage   2) You may resume regular meals tomorrow.  Today it is better to start with liquids and gradually work up to solid foods.  You may eat anything you prefer, but it is better to start with liquids, then soup and crackers, and gradually work up to solid foods.   3) Please notify your doctor immediately if you have any unusual bleeding, trouble breathing, redness and pain at the surgery site, drainage, fever, or pain not relieved by medication.    4) Additional Instructions:        Please contact your physician with any problems or Same Day Surgery at [phone], Monday through Friday 6 am to 4 pm, or [place] at [facility] number at [phone].
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
## 2019-06-17 NOTE — Op Note (Signed)
06/16/2019 - 06/17/2019  6:50 AM  Patient:   [name]  Pre-Op Diagnosis:   Closed anterior right prothesic hip dislocation.  Post-Op Diagnosis:   Same.  Procedure:   Closed reduction of anterior right prosthetic hip dislocation.  Surgeon:   Pascal Lux, MD  Assistant:   None  Anesthesia:   GET  Findings:   As above.  Complications:   None  EBL:   None  Fluids:   500 cc crystalloid  TT:   None  Drains:   None  Closure:   None  Implants:   None  Brief Clinical Note:   The patient is a 33 year old male who sustained the above-noted injury last evening when trying to use a vending machine. As he turned awkwardly, his hip dislocated and he could not reduce it. He was brought to the emergency room where several attempts were made at a closed reduction which were without success. Therefore, the patient is being brought to the operating room at this time for closed reduction under general anesthesia of this dislocation.  Procedure:   The patient was brought into the operating room and lain in the supine position. After adequate general endotracheal intubation and anesthesia were obtained, a timeout was performed to verify the appropriate surgical site. The hip was then reduced using longitudinal traction with slight flexion and subsequent internal rotation while countertraction was applied to the pelvis. The hip was felt to clunk back into place. The leg lengths were now equal and leg rotation was symmetric to the contralateral side. The hip was stable to flexion to 90 with internal rotation to 10. The adequacy of reduction was confirmed by fluoroscopic imaging. The patient was then awakened, extubated, and returned to the recovery room in satisfactory condition after tolerating the procedure well.

## 2019-06-18 NOTE — Anesthesia Postprocedure Evaluation (Signed)
Anesthesia Post Note  Patient: [name]  Procedure(s) Performed: CLOSED REDUCTION HIP (N/A Hip)  Patient location during evaluation: PACU Anesthesia Type: General Level of consciousness: awake and alert and oriented Pain management: pain level controlled Vital Signs Assessment: post-procedure vital signs reviewed and stable Respiratory status: spontaneous breathing Cardiovascular status: blood pressure returned to baseline Anesthetic complications: no     Last Vitals:  Vitals:   06/17/19 0800 06/17/19 0814  BP: 126/89 (!) 145/95  Pulse: (!) 105   Resp: (!) 21 20  Temp: 36.5 C   SpO2: 94% 100%    Last Pain:  Vitals:   06/17/19 0814  TempSrc:   PainSc: 2                  Zetha Kuhar

## 2019-07-02 ENCOUNTER — Emergency Department: Payer: Medicaid Other

## 2019-07-02 ENCOUNTER — Other Ambulatory Visit: Payer: Self-pay

## 2019-07-02 ENCOUNTER — Emergency Department: Payer: Medicaid Other | Admitting: Anesthesiology

## 2019-07-02 ENCOUNTER — Encounter: Payer: Self-pay | Admitting: *Deleted

## 2019-07-02 ENCOUNTER — Encounter
Admission: EM | Disposition: A | Discharge status: Hospice | Payer: Self-pay | Source: Home / Self Care | Attending: Emergency Medicine

## 2019-07-02 ENCOUNTER — Ambulatory Visit
Admission: EM | Admit: 2019-07-02 | Discharge: 2019-07-02 | Disposition: A | Discharge status: Hospice | Payer: Medicaid Other | Attending: Emergency Medicine | Admitting: Emergency Medicine

## 2019-07-02 DIAGNOSIS — Y792 Prosthetic and other implants, materials and accessory orthopedic devices associated with adverse incidents: Secondary | ICD-10-CM | POA: Insufficient documentation

## 2019-07-02 DIAGNOSIS — Z96641 Presence of right artificial hip joint: Secondary | ICD-10-CM

## 2019-07-02 DIAGNOSIS — J45909 Unspecified asthma, uncomplicated: Secondary | ICD-10-CM | POA: Diagnosis not present

## 2019-07-02 DIAGNOSIS — Z20828 Contact with and (suspected) exposure to other viral communicable diseases: Secondary | ICD-10-CM | POA: Diagnosis not present

## 2019-07-02 DIAGNOSIS — T84020A Dislocation of internal right hip prosthesis, initial encounter: Secondary | ICD-10-CM | POA: Diagnosis present

## 2019-07-02 DIAGNOSIS — F17210 Nicotine dependence, cigarettes, uncomplicated: Secondary | ICD-10-CM | POA: Insufficient documentation

## 2019-07-02 DIAGNOSIS — M24451 Recurrent dislocation, right hip: Secondary | ICD-10-CM

## 2019-07-02 DIAGNOSIS — Z419 Encounter for procedure for purposes other than remedying health state, unspecified: Secondary | ICD-10-CM

## 2019-07-02 HISTORY — PX: HIP CLOSED REDUCTION: SHX983

## 2019-07-02 LAB — CBC WITH DIFFERENTIAL/PLATELET
Abs Immature Granulocytes: 0.06 10*3/uL (ref 0.00–0.07)
Basophils Absolute: 0.1 10*3/uL (ref 0.0–0.1)
Basophils Relative: 1 %
Eosinophils Absolute: 0.1 10*3/uL (ref 0.0–0.5)
Eosinophils Relative: 1 %
HCT: 41.3 % (ref 39.0–52.0)
Hemoglobin: 14 g/dL (ref 13.0–17.0)
Immature Granulocytes: 1 %
Lymphocytes Relative: 10 %
Lymphs Abs: 1.1 10*3/uL (ref 0.7–4.0)
MCH: 30.4 pg (ref 26.0–34.0)
MCHC: 33.9 g/dL (ref 30.0–36.0)
MCV: 89.8 fL (ref 80.0–100.0)
Monocytes Absolute: 0.5 10*3/uL (ref 0.1–1.0)
Monocytes Relative: 5 %
Neutro Abs: 8.5 10*3/uL — ABNORMAL HIGH (ref 1.7–7.7)
Neutrophils Relative %: 82 %
Platelets: 253 10*3/uL (ref 150–400)
RBC: 4.6 MIL/uL (ref 4.22–5.81)
RDW: 13.5 % (ref 11.5–15.5)
WBC: 10.3 10*3/uL (ref 4.0–10.5)
nRBC: 0 % (ref 0.0–0.2)

## 2019-07-02 LAB — BASIC METABOLIC PANEL
Anion gap: 11 (ref 5–15)
BUN: 15 mg/dL (ref 6–20)
CO2: 24 mmol/L (ref 22–32)
Calcium: 9.2 mg/dL (ref 8.9–10.3)
Chloride: 103 mmol/L (ref 98–111)
Creatinine, Ser: 0.75 mg/dL (ref 0.61–1.24)
GFR calc Af Amer: >60 mL/min (ref >60)
GFR calc non Af Amer: >60 mL/min (ref >60)
Glucose, Bld: 106 mg/dL — ABNORMAL HIGH (ref 70–99)
Potassium: 3.8 mmol/L (ref 3.5–5.1)
Sodium: 138 mmol/L (ref 135–145)

## 2019-07-02 LAB — SURGICAL PCR SCREEN
MRSA, PCR: NEGATIVE
Staphylococcus aureus: NEGATIVE

## 2019-07-02 LAB — SARS CORONAVIRUS 2 BY RT PCR (HOSPITAL ORDER, PERFORMED IN ~~LOC~~ HOSPITAL LAB): SARS Coronavirus 2: NEGATIVE

## 2019-07-02 SURGERY — CLOSED REDUCTION, HIP
Anesthesia: General | Site: Hip | Laterality: Right

## 2019-07-02 MED ORDER — FENTANYL CITRATE (PF) 100 MCG/2ML IJ SOLN
INTRAMUSCULAR | Status: DC | PRN
  Administered 2019-07-02: 25 ug via INTRAVENOUS
  Administered 2019-07-02: 50 ug via INTRAVENOUS

## 2019-07-02 MED ORDER — PROPOFOL 10 MG/ML IV BOLUS
INTRAVENOUS | Status: AC
  Filled 2019-07-02: qty 20

## 2019-07-02 MED ORDER — LIDOCAINE HCL (CARDIAC) PF 100 MG/5ML IV SOSY
PREFILLED_SYRINGE | INTRAVENOUS | Status: DC | PRN
  Administered 2019-07-02: 80 mg via INTRAVENOUS

## 2019-07-02 MED ORDER — SODIUM CHLORIDE 0.9 % IV SOLN: INTRAVENOUS | Status: DC

## 2019-07-02 MED ORDER — OXYCODONE HCL 5 MG PO TABS
5.0000 mg | ORAL_TABLET | Freq: Once | ORAL | Status: AC | PRN
  Administered 2019-07-02: 5 mg via ORAL

## 2019-07-02 MED ORDER — POVIDONE-IODINE 10 % EX SWAB: 2.0000 "application " | Freq: Once | CUTANEOUS | Status: DC

## 2019-07-02 MED ORDER — LACTATED RINGERS IV SOLN
INTRAVENOUS | Status: DC | PRN
  Administered 2019-07-02: 10:00:00 via INTRAVENOUS

## 2019-07-02 MED ORDER — METOCLOPRAMIDE HCL 10 MG PO TABS: 5.0000 mg | ORAL_TABLET | Freq: Three times a day (TID) | ORAL | Status: DC | PRN

## 2019-07-02 MED ORDER — MIDAZOLAM HCL 2 MG/2ML IJ SOLN
INTRAMUSCULAR | Status: AC
  Filled 2019-07-02: qty 2

## 2019-07-02 MED ORDER — SUCCINYLCHOLINE CHLORIDE 20 MG/ML IJ SOLN
INTRAMUSCULAR | Status: DC | PRN
  Administered 2019-07-02: 80 mg via INTRAVENOUS

## 2019-07-02 MED ORDER — ONDANSETRON HCL 4 MG/2ML IJ SOLN
4.0000 mg | INTRAMUSCULAR | Status: AC
  Administered 2019-07-02: 4 mg via INTRAVENOUS
  Filled 2019-07-02: qty 2

## 2019-07-02 MED ORDER — HYDROMORPHONE HCL 1 MG/ML IJ SOLN
1.0000 mg | INTRAMUSCULAR | Status: AC
  Administered 2019-07-02: 1 mg via INTRAVENOUS
  Filled 2019-07-02: qty 1

## 2019-07-02 MED ORDER — DEXAMETHASONE SODIUM PHOSPHATE 10 MG/ML IJ SOLN
INTRAMUSCULAR | Status: AC
  Filled 2019-07-02: qty 1

## 2019-07-02 MED ORDER — PROMETHAZINE HCL 25 MG/ML IJ SOLN: 6.2500 mg | INTRAMUSCULAR | Status: DC | PRN

## 2019-07-02 MED ORDER — ONDANSETRON HCL 4 MG PO TABS: 4.0000 mg | ORAL_TABLET | Freq: Four times a day (QID) | ORAL | Status: DC | PRN

## 2019-07-02 MED ORDER — FENTANYL CITRATE (PF) 100 MCG/2ML IJ SOLN
INTRAMUSCULAR | Status: AC
  Filled 2019-07-02: qty 2

## 2019-07-02 MED ORDER — CHLORHEXIDINE GLUCONATE 4 % EX LIQD: 60.0000 mL | Freq: Once | CUTANEOUS | Status: DC

## 2019-07-02 MED ORDER — OXYCODONE HCL 5 MG PO TABS
ORAL_TABLET | ORAL | Status: AC
  Filled 2019-07-02: qty 1

## 2019-07-02 MED ORDER — OXYCODONE HCL 5 MG/5ML PO SOLN: 5.0000 mg | Freq: Once | ORAL | Status: AC | PRN

## 2019-07-02 MED ORDER — PROPOFOL 10 MG/ML IV BOLUS
INTRAVENOUS | Status: DC | PRN
  Administered 2019-07-02: 150 mg via INTRAVENOUS

## 2019-07-02 MED ORDER — LIDOCAINE HCL (PF) 2 % IJ SOLN
INTRAMUSCULAR | Status: AC
  Filled 2019-07-02: qty 10

## 2019-07-02 MED ORDER — MEPERIDINE HCL 50 MG/ML IJ SOLN: 6.2500 mg | INTRAMUSCULAR | Status: DC | PRN

## 2019-07-02 MED ORDER — ONDANSETRON HCL 4 MG/2ML IJ SOLN
INTRAMUSCULAR | Status: DC | PRN
  Administered 2019-07-02: 4 mg via INTRAVENOUS

## 2019-07-02 MED ORDER — DEXAMETHASONE SODIUM PHOSPHATE 10 MG/ML IJ SOLN
INTRAMUSCULAR | Status: DC | PRN
  Administered 2019-07-02: 10 mg via INTRAVENOUS

## 2019-07-02 MED ORDER — ONDANSETRON HCL 4 MG/2ML IJ SOLN: 4.0000 mg | Freq: Four times a day (QID) | INTRAMUSCULAR | Status: DC | PRN

## 2019-07-02 MED ORDER — METOCLOPRAMIDE HCL 5 MG/ML IJ SOLN: 5.0000 mg | Freq: Three times a day (TID) | INTRAMUSCULAR | Status: DC | PRN

## 2019-07-02 MED ORDER — MIDAZOLAM HCL 2 MG/2ML IJ SOLN
INTRAMUSCULAR | Status: DC | PRN
  Administered 2019-07-02: 1 mg via INTRAVENOUS

## 2019-07-02 MED ORDER — FENTANYL CITRATE (PF) 100 MCG/2ML IJ SOLN: 25.0000 ug | INTRAMUSCULAR | Status: DC | PRN

## 2019-07-02 MED ORDER — ONDANSETRON HCL 4 MG/2ML IJ SOLN
INTRAMUSCULAR | Status: AC
  Filled 2019-07-02: qty 2

## 2019-07-02 SURGICAL SUPPLY — 12 items
COVER WAND RF STERILE (DRAPES) ×2 IMPLANT
DRSG EMULSION OIL 3X8 NADH (GAUZE/BANDAGES/DRESSINGS) IMPLANT
ELECT CAUTERY BLADE 6.4 (BLADE) IMPLANT
GLOVE BIOGEL PI IND STRL 9 (GLOVE) IMPLANT
GLOVE BIOGEL PI INDICATOR 9 (GLOVE)
GLOVE SURG SYN 9.0  PF PI (GLOVE)
GLOVE SURG SYN 9.0 PF PI (GLOVE) IMPLANT
GOWN SRG 2XL LVL 4 RGLN SLV (GOWNS) IMPLANT
GOWN STRL NON-REIN 2XL LVL4 (GOWNS)
KIT TURNOVER KIT A (KITS) IMPLANT
PACK HIP PROSTHESIS (MISCELLANEOUS) IMPLANT
SCALPEL PROTECTED #10 DISP (BLADE) ×4 IMPLANT

## 2019-07-02 NOTE — Transfer of Care (Signed)
Immediate Anesthesia Transfer of Care Note  Patient: [name]  Procedure(s) Performed: CLOSED REDUCTION HIP (Right Hip)  Patient Location: PACU  Anesthesia Type:General  Level of Consciousness: drowsy and patient cooperative  Airway & Oxygen Therapy: Patient Spontanous Breathing and Patient connected to face mask oxygen  Post-op Assessment: Report given to RN and Post -op Vital signs reviewed and stable  Post vital signs: Reviewed and stable  Last Vitals:  Vitals Value Taken Time  BP 137/86 07/02/19 1019  Temp    Pulse 72 07/02/19 1021  Resp 17 07/02/19 1021  SpO2 100 % 07/02/19 1021  Vitals shown include unvalidated device data.  Last Pain:  Vitals:   07/02/19 1019  TempSrc:   PainSc: (P) Asleep      Patients Stated Pain Goal: 2 (56/97/94 8016)  Complications: No apparent anesthesia complications

## 2019-07-02 NOTE — ED Notes (Signed)
Pt has red marker scrawled on bilateral lower extremities. Pt stated "I was trying to figure out how to put my own hip back in place." when asked why he had marker all over his legs.

## 2019-07-02 NOTE — Discharge Instructions (Addendum)
Try to keep the right foot turned in as much as possible when standing.  Try to sit as much as you can as that should not allow for dislocation.  AMBULATORY SURGERY  DISCHARGE INSTRUCTIONS   1) The drugs that you were given will stay in your system until tomorrow so for the next 24 hours you should not:  A) Drive an automobile B) Make any legal decisions C) Drink any alcoholic beverage   2) You may resume regular meals tomorrow.  Today it is better to start with liquids and gradually work up to solid foods.  You may eat anything you prefer, but it is better to start with liquids, then soup and crackers, and gradually work up to solid foods.   3) Please notify your doctor immediately if you have any unusual bleeding, trouble breathing, redness and pain at the surgery site, drainage, fever, or pain not relieved by medication.    4) Additional Instructions:        Please contact your physician with any problems or Same Day Surgery at [phone], Monday through Friday 6 am to 4 pm, or [place] at [facility] number at [phone].

## 2019-07-02 NOTE — ED Notes (Signed)
Pt's speech is slurred and he makes statements that do not always make sense. Pt appears altered, but denies ETOH and drug use.
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
## 2019-07-02 NOTE — Op Note (Signed)
07/02/2019  10:02 AM  PATIENT:  [name]  33 y.o. male  PRE-OPERATIVE DIAGNOSIS:  RIGHT HIP DISLOCATION, prosthetic total hip anterior  POST-OPERATIVE DIAGNOSIS:  RIGHT HIP DISLOCATION, same  PROCEDURE:  Procedure(s): CLOSED REDUCTION HIP (Right)  SURGEON: Laurene Footman, MD  ASSISTANTS: None  ANESTHESIA:   general  EBL:  No intake/output data recorded.  BLOOD ADMINISTERED:none  DRAINS: none   LOCAL MEDICATIONS USED:  NONE  SPECIMEN:  No Specimen  DISPOSITION OF SPECIMEN:  N/A  COUNTS:  NO Closed procedure no count required  TOURNIQUET:  * No tourniquets in log *  IMPLANTS: None  DICTATION: .Dragon Dictation patient was brought to the operating room and after adequate anesthesia was obtained and appropriate patient identification and timeout procedure were completed reduction was carried out with longitudinal traction slight flexion and internal rotation and palpable and audible reduction was obtained.  C arm was brought in to show the reduction and showed reduced total hip dislocation additionally with the leg in flexion and external rotation the hip was stable in extension with abduction with external rotation the hip could be seen to sublux consistent with  anterior instability of the total hip.  There is not refill there is a brace for this and he will be discharged without a brace  PLAN OF CARE: Discharge to home after PACU  PATIENT DISPOSITION:  PACU - hemodynamically stable.

## 2019-07-02 NOTE — Anesthesia Preprocedure Evaluation (Signed)
Anesthesia Evaluation  Patient identified by MRN, date of birth, ID band Patient awake    Reviewed: Allergy & Precautions, NPO status , Patient's Chart, lab work & pertinent test results  History of Anesthesia Complications Negative for: history of anesthetic complications  Airway Mallampati: II  TM Distance: >3 FB Neck ROM: Full    Dental no notable dental hx.    Pulmonary asthma , Current Smoker and Patient abstained from smoking.,    breath sounds clear to auscultation- rhonchi (-) wheezing      Cardiovascular Exercise Tolerance: Good (-) hypertension(-) CAD, (-) Past MI, (-) Cardiac Stents and (-) CABG  Rhythm:Regular Rate:Normal - Systolic murmurs and - Diastolic murmurs    Neuro/Psych neg Seizures negative neurological ROS  negative psych ROS   GI/Hepatic negative GI ROS, Neg liver ROS,   Endo/Other  negative endocrine ROSneg diabetes  Renal/GU negative Renal ROS     Musculoskeletal Dislocated hip prosthesis    Abdominal (+) - obese,   Peds  Hematology negative hematology ROS (+)   Anesthesia Other Findings Past Medical History: No date: Asthma   Reproductive/Obstetrics                             Anesthesia Physical Anesthesia Plan  ASA: II  Anesthesia Plan: General   Post-op Pain Management:    Induction: Intravenous  PONV Risk Score and Plan: 0 and Ondansetron and Midazolam  Airway Management Planned: Oral ETT  Additional Equipment:   Intra-op Plan:   Post-operative Plan: Extubation in OR  Informed Consent: I have reviewed the patients History and Physical, chart, labs and discussed the procedure including the risks, benefits and alternatives for the proposed anesthesia with the patient or authorized representative who has indicated his/her understanding and acceptance.     Dental advisory given  Plan Discussed with: CRNA and Anesthesiologist  Anesthesia  Plan Comments:         Anesthesia Quick Evaluation

## 2019-07-02 NOTE — OR Nursing (Signed)
Discharge was pending pt's ride home.

## 2019-07-02 NOTE — Progress Notes (Signed)
Dr. Randa Lynn at bedside to evaluate pt. Stated pt was ok to go home.

## 2019-07-02 NOTE — Anesthesia Postprocedure Evaluation (Signed)
Anesthesia Post Note  Patient: [name]  Procedure(s) Performed: CLOSED REDUCTION HIP (Right Hip)  Patient location during evaluation: PACU Anesthesia Type: General Level of consciousness: awake and alert and oriented Pain management: pain level controlled Vital Signs Assessment: post-procedure vital signs reviewed and stable Respiratory status: spontaneous breathing, nonlabored ventilation and respiratory function stable Cardiovascular status: blood pressure returned to baseline and stable Postop Assessment: no signs of nausea or vomiting Anesthetic complications: no     Last Vitals:  Vitals:   07/02/19 1119 07/02/19 1131  BP: 126/84 138/82  Pulse: 94 91  Resp: 15 18  Temp:  36.8 C  SpO2: 100% 100%    Last Pain:  Vitals:   07/02/19 1202  TempSrc:   PainSc: 6                  Eleonora Peeler

## 2019-07-02 NOTE — Progress Notes (Addendum)
Per Jonna Clark CRNA, when pt was being woken up from anesthesia, clear fluid noted coming from bilateral ears. No discharge noted from bilateral ears in PACU at this time. Dr. Rudene Christians notified. Stated pt needs to f/u with PCP. Dr. Randa Lynn notified. Stated she would come to see pt.
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
## 2019-07-02 NOTE — ED Notes (Signed)
Same Day here to transport patient for surgery.

## 2019-07-02 NOTE — Anesthesia Post-op Follow-up Note (Signed)
Anesthesia QCDR form completed.        

## 2019-07-02 NOTE — Anesthesia Procedure Notes (Addendum)
Procedure Name: Intubation Date/Time: 07/02/2019 9:49 AM Performed by: Lily Peer, Summer, RN Pre-anesthesia Checklist: Patient identified, Patient being monitored, Timeout performed, Emergency Drugs available and Suction available Patient Re-evaluated:Patient Re-evaluated prior to induction Oxygen Delivery Method: Circle system utilized Preoxygenation: Pre-oxygenation with 100% oxygen Induction Type: IV induction Ventilation: Mask ventilation without difficulty Laryngoscope Size: McGraph and 4 Grade View: Grade I Tube type: Oral Tube size: 7.5 mm Number of attempts: 1 Airway Equipment and Method: Stylet Placement Confirmation: ETT inserted through vocal cords under direct vision,  positive ETCO2 and breath sounds checked- equal and bilateral Secured at: 23 cm Tube secured with: Tape Dental Injury: Teeth and Oropharynx as per pre-operative assessment

## 2019-07-02 NOTE — ED Notes (Signed)
This nurse gave report to Same Day nurse

## 2019-07-03 ENCOUNTER — Encounter: Payer: Self-pay | Admitting: Orthopedic Surgery

## 2019-10-23 ENCOUNTER — Other Ambulatory Visit: Payer: Self-pay | Admitting: Orthopedic Surgery

## 2019-10-23 DIAGNOSIS — M24451 Recurrent dislocation, right hip: Secondary | ICD-10-CM

## 2019-11-03 ENCOUNTER — Emergency Department: Payer: Medicaid Other

## 2019-11-03 ENCOUNTER — Emergency Department
Admission: EM | Admit: 2019-11-03 | Discharge: 2019-11-04 | Disposition: A | Discharge status: Home / Self Care | Payer: Medicaid Other | Attending: Student | Admitting: Student

## 2019-11-03 ENCOUNTER — Other Ambulatory Visit: Payer: Self-pay

## 2019-11-03 DIAGNOSIS — J45909 Unspecified asthma, uncomplicated: Secondary | ICD-10-CM | POA: Insufficient documentation

## 2019-11-03 DIAGNOSIS — F17200 Nicotine dependence, unspecified, uncomplicated: Secondary | ICD-10-CM | POA: Insufficient documentation

## 2019-11-03 DIAGNOSIS — F1910 Other psychoactive substance abuse, uncomplicated: Secondary | ICD-10-CM | POA: Diagnosis not present

## 2019-11-03 DIAGNOSIS — R400 Somnolence: Secondary | ICD-10-CM | POA: Diagnosis present

## 2019-11-03 LAB — URINALYSIS, COMPLETE (UACMP) WITH MICROSCOPIC
Bacteria, UA: NONE SEEN
Bilirubin Urine: NEGATIVE
Glucose, UA: NEGATIVE mg/dL
Hgb urine dipstick: NEGATIVE
Ketones, ur: 5 mg/dL — AB
Leukocytes,Ua: NEGATIVE
Nitrite: NEGATIVE
Protein, ur: NEGATIVE mg/dL
Specific Gravity, Urine: 1.024 (ref 1.005–1.030)
Squamous Epithelial / HPF: NONE SEEN (ref 0–5)
pH: 5 (ref 5.0–8.0)

## 2019-11-03 LAB — URINE DRUG SCREEN, QUALITATIVE (ARMC ONLY)
Amphetamines, Ur Screen: POSITIVE — AB
Barbiturates, Ur Screen: NOT DETECTED
Benzodiazepine, Ur Scrn: NOT DETECTED
Cannabinoid 50 Ng, Ur ~~LOC~~: POSITIVE — AB
Cocaine Metabolite,Ur ~~LOC~~: NOT DETECTED
MDMA (Ecstasy)Ur Screen: NOT DETECTED
Methadone Scn, Ur: NOT DETECTED
Opiate, Ur Screen: POSITIVE — AB
Phencyclidine (PCP) Ur S: NOT DETECTED
Tricyclic, Ur Screen: NOT DETECTED

## 2019-11-03 LAB — CBC
HCT: 47.4 % (ref 39.0–52.0)
Hemoglobin: 16.4 g/dL (ref 13.0–17.0)
MCH: 31.8 pg (ref 26.0–34.0)
MCHC: 34.6 g/dL (ref 30.0–36.0)
MCV: 91.9 fL (ref 80.0–100.0)
Platelets: 261 10*3/uL (ref 150–400)
RBC: 5.16 MIL/uL (ref 4.22–5.81)
RDW: 13.5 % (ref 11.5–15.5)
WBC: 8.9 10*3/uL (ref 4.0–10.5)
nRBC: 0 % (ref 0.0–0.2)

## 2019-11-03 LAB — BASIC METABOLIC PANEL
Anion gap: 10 (ref 5–15)
BUN: 13 mg/dL (ref 6–20)
CO2: 25 mmol/L (ref 22–32)
Calcium: 9.1 mg/dL (ref 8.9–10.3)
Chloride: 106 mmol/L (ref 98–111)
Creatinine, Ser: 1.21 mg/dL (ref 0.61–1.24)
GFR calc Af Amer: >60 mL/min (ref >60)
GFR calc non Af Amer: >60 mL/min (ref >60)
Glucose, Bld: 115 mg/dL — ABNORMAL HIGH (ref 70–99)
Potassium: 3.8 mmol/L (ref 3.5–5.1)
Sodium: 141 mmol/L (ref 135–145)

## 2019-11-03 LAB — ACETAMINOPHEN LEVEL: Acetaminophen (Tylenol), Serum: <10 ug/mL — ABNORMAL LOW (ref 10–30)

## 2019-11-03 LAB — SALICYLATE LEVEL: Salicylate Lvl: <7 mg/dL — ABNORMAL LOW (ref 7.0–30.0)

## 2019-11-03 LAB — ETHANOL: Alcohol, Ethyl (B): <10 mg/dL (ref <10)
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
## 2019-11-03 NOTE — ED Notes (Signed)
Pt requesting to be disconnected, pt ambulatory in room without difficulty  Pt father called and updated  Pt provided urine
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
## 2019-11-03 NOTE — ED Notes (Signed)
ED Provider at bedside.  Lobby calling for this RN to update family, pt has cell phone at bedside  Ginger ale provided

## 2019-11-03 NOTE — Discharge Instructions (Signed)
You were seen in the emergency department for polysubstance abuse.  Please seek help from the recommended resources for assistance with your drug use.  If you have any thoughts of hurting herself or others, please call 911 or return to the emergency department.  Please avoid drug and alcohol use.  Never drive a vehicle or operate machinery while intoxicated.

## 2019-11-03 NOTE — ED Notes (Addendum)
Pt back from CT att, urine sample requested, ice water provided

## 2019-11-03 NOTE — ED Provider Notes (Signed)
[facility] Emergency Department Provider Note  ____________________________________________   First MD Initiated Contact with Patient 11/03/19 1731     (approximate)  I have reviewed the triage vital signs and the nursing notes.  History  Chief Complaint Altered Mental Status    HPI [name] is a 34 y.o. male with history of recurrent hip dislocations who presents to the emergency department with concern for possible overdose and/or drug use.  EMS were called to a [employer] store bystanders who were concerned for unknown medical condition/possible OD.  He was apparently found diaphoretic and somewhat difficult to arouse. On arrival to the ED he does appear somewhat intoxicated.  Somnolent, but easy to arouse.  Does not make any sense when answering questions.  Denies any drug use, but when asked about the possibility of any opiate use he states "sometimes I get it from the [place] if it is on sale." No Narcan or other medications administered by EMS.   Past Medical Hx Past Medical History:  Diagnosis Date  . Asthma     Problem List There are no problems to display for this patient.   Past Surgical Hx Past Surgical History:  Procedure Laterality Date  . HIP CLOSED REDUCTION    . HIP CLOSED REDUCTION N/A 06/17/2019   Procedure: CLOSED REDUCTION HIP;  Surgeon: Corky Mull, MD;  Location: [facility] ORS;  Service: Orthopedics;  Laterality: N/A;  . HIP CLOSED REDUCTION Right 07/02/2019   Procedure: CLOSED REDUCTION HIP;  Surgeon: Hessie Knows, MD;  Location: [facility] ORS;  Service: Orthopedics;  Laterality: Right;    Medications Prior to Admission medications   Medication Sig Start Date End Date Taking? Authorizing Provider  HYDROcodone-acetaminophen (NORCO) 5-325 MG tablet Take 1-2 tablets by mouth every 6 (six) hours as needed for moderate pain. MAXIMUM TOTAL ACETAMINOPHEN DOSE IS 4000 MG PER DAY 06/17/19   Poggi, Marshall Cork, MD  traMADol (ULTRAM) 50 MG  tablet Take 1 tablet (50 mg total) by mouth every 6 (six) hours as needed. 06/17/19 06/16/20  Poggi, Marshall Cork, MD    Allergies Patient has no known allergies.  Family Hx History reviewed. No pertinent family history.  Social Hx Social History   Tobacco Use  . Smoking status: Current Every Day Smoker    Packs/day: 0.50  . Smokeless tobacco: Never Used  Substance Use Topics  . Alcohol use: Never  . Drug use: Never     Review of Systems Unable to obtain due to altered mental status.   Physical Exam  Vital Signs: ED Triage Vitals  Enc Vitals Group     BP 11/03/19 1716 (!) 156/101     Pulse Rate 11/03/19 1730 (!) 107     Resp 11/03/19 1730 13     Temp 11/03/19 1716 97.9 F (36.6 C)     Temp Source 11/03/19 1716 Oral     SpO2 11/03/19 1730 94 %     Weight 11/03/19 1719 170 lb (77.1 kg)     Height 11/03/19 1719 6' (1.829 m)     Head Circumference --      Peak Flow --      Pain Score 11/03/19 1719 Asleep     Pain Loc --      Pain Edu? --      Excl. in Wall? --     Constitutional: Sleeping, but arouses easily to voice.  Seems intoxicated. Head: Normocephalic. Atraumatic. Eyes: Conjunctivae clear. Sclera anicteric. Pupils pinpoint and symmetric. Nose: No masses  or lesions. No congestion or rhinorrhea. Mouth/Throat: Wearing mask.  Neck: No stridor. Trachea midline.  Cardiovascular: Normal rate, regular rhythm. Extremities well perfused. Respiratory: Normal respiratory effort, no respiratory depression, no apnea, no hypoxia.  Lungs CTAB. Gastrointestinal: Soft. Non-distended. Non-tender.  Genitourinary: Deferred. Musculoskeletal: In a low back/hip brace for history of recurrent hip dislocations. Neurologic: Question intoxication with some slurred speech. No gross focal or lateralizing neurologic deficits are appreciated.  Skin: Skin is warm, dry and intact. No rash noted. Psychiatric: Mood and affect are appropriate for situation.  EKG  Personally reviewed and  interpreted by myself.   Rate: 98 Rhythm: Sinus Axis: Normal Intervals: Within normal limits No acute ischemic changes No STEMI    Radiology  CT head  IMPRESSION:  1. No acute intracranial process.  2. Minimal mucosal thickening left ethmoid sinuses.     Procedures  Procedure(s) performed (including critical care):  Procedures   Initial Impression / Assessment and Plan / MDM / ED Course  34 y.o. male who presents to the ED with concern for possible drug use versus overdose  Ddx: drug use/toxicologic, intracranial, doubt infectious etiology based on presentation  Will plan for labs, imaging, EKG, continuous monitoring  Clinical Course as of Nov 03 1204  Wynelle Link Nov 03, 2019  2035 Patient more alert and oriented now, though does continue to doze off. He states that he was sitting down on the side of the road today because his hip was aching and bothering him (not uncommon due to his hx, no evidence of recurrent dislocation) and was resting. He thinks someone may have activated EMS when they saw him. He continues to adamantly deny any drug use.  Pupils starting to seem more regular in size now, which seems more consistent with metabolization of substance use; but again, he continues to deny.  Work-up thus far has been unremarkable, including CT head.  Awaiting urine.   [SM]    Clinical Course User Index [SM] Miguel Aschoff., MD   UDS positive for amphetamines, opiates, and cannabis. This seems most consistent w/ his presentation today. Remainder of work up is unremarkable. Will plan continue to monitor to sobriety and then plan for discharge.   _______________________________   As part of my medical decision making I have reviewed available labs, radiology tests, reviewed old records.  Final Clinical Impression(s) / ED Diagnosis  Final diagnoses:  Polysubstance abuse (HCC)       Note:  This document was prepared using Dragon voice recognition software and may  include unintentional dictation errors.   Miguel Aschoff., MD 11/04/19 [phone]

## 2019-11-03 NOTE — ED Notes (Signed)
Staff removed a gun from L flank and a knife from pt pocket. Labeled and secured at this time.

## 2019-11-03 NOTE — ED Notes (Addendum)
Att to 2020 Call to father, updated with pt's permission  Per father pt is suffering from recent loss of close friend and stressors within immediate family [child and child's mother] and is concerned that these stressors would lead to pt to abuse substances  Please call with updates, [phone], [name], father

## 2019-11-04 NOTE — ED Notes (Signed)
No peripheral IV placed this visit.    Discharge instructions reviewed with patient. Questions fielded by this RN. Patient verbalizes understanding of instructions. Patient discharged home in stable condition per forbach. No acute distress noted at time of discharge.    Pt walked to father's vehicle, pt encouraged to seek addiction help at [facility] or with our ED.   Security Customer service manager, M, gave firearm and knife property to father, pt in vehicle at [employer]

## 2019-11-04 NOTE — ED Notes (Signed)
Pt's father contacted for pt pick up, security called to bring firearm and knife to pt's father

## 2020-06-15 ENCOUNTER — Emergency Department
Admission: EM | Admit: 2020-06-15 | Discharge: 2020-06-15 | Disposition: A | Discharge status: Home / Self Care | Payer: Medicaid Other | Attending: Emergency Medicine | Admitting: Emergency Medicine

## 2020-06-15 ENCOUNTER — Emergency Department: Payer: Medicaid Other

## 2020-06-15 DIAGNOSIS — Z20822 Contact with and (suspected) exposure to covid-19: Secondary | ICD-10-CM | POA: Insufficient documentation

## 2020-06-15 DIAGNOSIS — J45909 Unspecified asthma, uncomplicated: Secondary | ICD-10-CM | POA: Insufficient documentation

## 2020-06-15 DIAGNOSIS — F17200 Nicotine dependence, unspecified, uncomplicated: Secondary | ICD-10-CM | POA: Diagnosis not present

## 2020-06-15 DIAGNOSIS — F1910 Other psychoactive substance abuse, uncomplicated: Secondary | ICD-10-CM | POA: Diagnosis present

## 2020-06-15 DIAGNOSIS — T401X1A Poisoning by heroin, accidental (unintentional), initial encounter: Secondary | ICD-10-CM

## 2020-06-15 LAB — BASIC METABOLIC PANEL
Anion gap: 8 (ref 5–15)
BUN: 8 mg/dL (ref 6–20)
CO2: 27 mmol/L (ref 22–32)
Calcium: 8.8 mg/dL — ABNORMAL LOW (ref 8.9–10.3)
Chloride: 102 mmol/L (ref 98–111)
Creatinine, Ser: 1.04 mg/dL (ref 0.61–1.24)
GFR, Estimated: >60 mL/min (ref >60)
Glucose, Bld: 141 mg/dL — ABNORMAL HIGH (ref 70–99)
Potassium: 3.5 mmol/L (ref 3.5–5.1)
Sodium: 137 mmol/L (ref 135–145)

## 2020-06-15 LAB — CBC WITH DIFFERENTIAL/PLATELET
Abs Immature Granulocytes: 0.06 10*3/uL (ref 0.00–0.07)
Basophils Absolute: 0.1 10*3/uL (ref 0.0–0.1)
Basophils Relative: 1 %
Eosinophils Absolute: 0.3 10*3/uL (ref 0.0–0.5)
Eosinophils Relative: 6 %
HCT: 47.5 % (ref 39.0–52.0)
Hemoglobin: 15.9 g/dL (ref 13.0–17.0)
Immature Granulocytes: 1 %
Lymphocytes Relative: 27 %
Lymphs Abs: 1.6 10*3/uL (ref 0.7–4.0)
MCH: 30.4 pg (ref 26.0–34.0)
MCHC: 33.5 g/dL (ref 30.0–36.0)
MCV: 90.8 fL (ref 80.0–100.0)
Monocytes Absolute: 0.6 10*3/uL (ref 0.1–1.0)
Monocytes Relative: 10 %
Neutro Abs: 3.2 10*3/uL (ref 1.7–7.7)
Neutrophils Relative %: 55 %
Platelets: 267 10*3/uL (ref 150–400)
RBC: 5.23 MIL/uL (ref 4.22–5.81)
RDW: 12.8 % (ref 11.5–15.5)
WBC: 5.8 10*3/uL (ref 4.0–10.5)
nRBC: 0 % (ref 0.0–0.2)

## 2020-06-15 LAB — URINE DRUG SCREEN, QUALITATIVE (ARMC ONLY)
Amphetamines, Ur Screen: POSITIVE — AB
Barbiturates, Ur Screen: NOT DETECTED
Benzodiazepine, Ur Scrn: NOT DETECTED
Cannabinoid 50 Ng, Ur ~~LOC~~: POSITIVE — AB
Cocaine Metabolite,Ur ~~LOC~~: NOT DETECTED
MDMA (Ecstasy)Ur Screen: NOT DETECTED
Methadone Scn, Ur: NOT DETECTED
Opiate, Ur Screen: NOT DETECTED
Phencyclidine (PCP) Ur S: NOT DETECTED
Tricyclic, Ur Screen: NOT DETECTED

## 2020-06-15 LAB — ACETAMINOPHEN LEVEL: Acetaminophen (Tylenol), Serum: <10 ug/mL — ABNORMAL LOW (ref 10–30)

## 2020-06-15 LAB — ETHANOL: Alcohol, Ethyl (B): <10 mg/dL (ref <10)

## 2020-06-15 LAB — RESPIRATORY PANEL BY RT PCR (FLU A&B, COVID)
Influenza A by PCR: NEGATIVE
Influenza B by PCR: NEGATIVE
SARS Coronavirus 2 by RT PCR: NEGATIVE

## 2020-06-15 LAB — SALICYLATE LEVEL: Salicylate Lvl: <7 mg/dL — ABNORMAL LOW (ref 7.0–30.0)

## 2020-06-15 MED ORDER — LACTATED RINGERS IV BOLUS
1000.0000 mL | Freq: Once | INTRAVENOUS | Status: AC
  Administered 2020-06-15: 1000 mL via INTRAVENOUS

## 2020-06-15 MED ORDER — NALOXONE HCL 4 MG/0.1ML NA LIQD: NASAL | 0 refills | Status: DC

## 2020-06-15 MED ORDER — ONDANSETRON HCL 4 MG/2ML IJ SOLN
4.0000 mg | Freq: Once | INTRAMUSCULAR | Status: AC
  Administered 2020-06-15: 4 mg via INTRAVENOUS
  Filled 2020-06-15: qty 2

## 2020-06-15 NOTE — ED Notes (Signed)
This RN on phone with staff at [facility]. Staff stating they have a bed available but cannot accept pt without paperwork being faxed over. MD made aware. Aqua from [facility] made aware.

## 2020-06-15 NOTE — ED Notes (Signed)
This RN on phone with Unk Pinto, [phone], stating pt has been accepted and has a bed at [facility]. MD made aware.

## 2020-06-15 NOTE — ED Provider Notes (Signed)
[facility] Emergency Department Provider Note  ____________________________________________  Time seen: Approximately 2:44 PM  I have reviewed the triage vital signs and the nursing notes.   HISTORY  Chief Complaint Drug Overdose    Level 5 Caveat: Portions of the History and Physical including HPI and review of systems are unable to be completely obtained due to patient being a poor historian   HPI [name] is a 34 y.o. male with a past history of asthma and IV drug use who comes to the ED by EMS due to unresponsiveness.  Patient was given 4 mg of IM Narcan by fire department and was able to ambulate to the ambulance.  EMS noted some ongoing drowsiness so they gave an additional 0.5 mg of Narcan but patient was not having any respiratory depression at the time.  On arrival to ED, patient opens his eyes to voice and follows commands, denies any shortness of breath or pain.  No other complaints.  States that he injected today without difficulty.  No recent fevers or chills, no back pain palpitations or headaches.      Past Medical History:  Diagnosis Date  . Asthma      There are no problems to display for this patient.    Past Surgical History:  Procedure Laterality Date  . HIP CLOSED REDUCTION    . HIP CLOSED REDUCTION N/A 06/17/2019   Procedure: CLOSED REDUCTION HIP;  Surgeon: Christena Flake, MD;  Location: [facility] ORS;  Service: Orthopedics;  Laterality: N/A;  . HIP CLOSED REDUCTION Right 07/02/2019   Procedure: CLOSED REDUCTION HIP;  Surgeon: Kennedy Bucker, MD;  Location: [facility] ORS;  Service: Orthopedics;  Laterality: Right;     Prior to Admission medications   Not on File     Allergies Patient has no known allergies.   No family history on file.  Social History Social History   Tobacco Use  . Smoking status: Current Every Day Smoker    Packs/day: 0.50  . Smokeless tobacco: Never Used  Substance Use Topics  . Alcohol use:  Never  . Drug use: Never    Review of Systems Level 5 Caveat: Portions of the History and Physical including HPI and review of systems are unable to be completely obtained due to patient being a poor historian   Constitutional:   No known fever.  ENT:   No rhinorrhea. Cardiovascular:   No chest pain or syncope. Respiratory:   No dyspnea or cough. Gastrointestinal:   Negative for abdominal pain, vomiting and diarrhea.  Musculoskeletal:   Negative for focal pain or swelling ____________________________________________   PHYSICAL EXAM:  VITAL SIGNS: ED Triage Vitals  Enc Vitals Group     BP 06/15/20 1310 (!) 135/91     Pulse Rate 06/15/20 1310 96     Resp 06/15/20 1310 10     Temp 06/15/20 1318 (!) 96.5 F (35.8 C)     Temp Source 06/15/20 1318 Axillary     SpO2 06/15/20 1310 95 %     Weight --      Height --      Head Circumference --      Peak Flow --      Pain Score 06/15/20 1311 0     Pain Loc --      Pain Edu? --      Excl. in GC? --     Vital signs reviewed, nursing assessments reviewed.   Constitutional:   Alert and oriented. Non-toxic  appearance. Eyes:   Conjunctivae are normal. EOMI. PERRL. ENT      Head:   Normocephalic and atraumatic.      Nose:   No congestion/rhinnorhea.       Mouth/Throat:   MMM, no pharyngeal erythema. No peritonsillar mass.       Neck:   No meningismus. Full ROM. Hematological/Lymphatic/Immunilogical:   No cervical lymphadenopathy. Cardiovascular:   RRR. Symmetric bilateral radial and DP pulses.  No murmurs. Cap refill less than 2 seconds. Respiratory:   Normal respiratory effort without tachypnea/retractions. Breath sounds are clear and equal bilaterally. No wheezes/rales/rhonchi. Gastrointestinal:   Soft and nontender. Non distended. There is no CVA tenderness.  No rebound, rigidity, or guarding.  Musculoskeletal:   Normal range of motion in all extremities. No joint effusions.  No lower extremity tenderness.  No edema. Neurologic:    Normal speech and language.  Motor grossly intact. No acute focal neurologic deficits are appreciated.  Skin:    Skin is warm and intact. Some mild diaphoresis. No track marks or inflammatory changes. No rash noted.  No petechiae, purpura, or bullae.  ____________________________________________    LABS (pertinent positives/negatives) (all labs ordered are listed, but only abnormal results are displayed) Labs Reviewed  BASIC METABOLIC PANEL - Abnormal; Notable for the following components:      Result Value   Glucose, Bld 141 (*)    Calcium 8.8 (*)    All other components within normal limits  RESPIRATORY PANEL BY RT PCR (FLU A&B, COVID)  CBC WITH DIFFERENTIAL/PLATELET  URINE DRUG SCREEN, QUALITATIVE ([facility] ONLY)   ____________________________________________   EKG  Interpreted by me Normal sinus rhythm rate of 93, right axis, normal intervals.  Normal QRS ST segments and T waves.  No ischemic changes.  No evidence of underlying toxicity.  ____________________________________________    RADIOLOGY  DG Chest Portable 1 View  Result Date: 06/15/2020 CLINICAL DATA:  Overdose EXAM: PORTABLE CHEST 1 VIEW COMPARISON:  09/19/2014 FINDINGS: The heart size and mediastinal contours are within normal limits. Both lungs are clear. The visualized skeletal structures are unremarkable. IMPRESSION: No active disease. Electronically Signed   By: Duanne Guess D.O.   On: 06/15/2020 13:36    ____________________________________________   PROCEDURES Procedures  ____________________________________________    CLINICAL IMPRESSION / ASSESSMENT AND PLAN / ED COURSE  Medications ordered in the ED: Medications  lactated ringers bolus 1,000 mL (1,000 mLs Intravenous New Bag/Given 06/15/20 1317)  ondansetron (ZOFRAN) injection 4 mg (4 mg Intravenous Given 06/15/20 1317)    Pertinent labs & imaging results that were available during my care of the patient were reviewed by me and  considered in my medical decision making (see chart for details).   [name] was evaluated in Emergency Department on 06/15/2020 for the symptoms described in the history of present illness. He was evaluated in the context of the global COVID-19 pandemic, which necessitated consideration that the patient might be at risk for infection with the SARS-CoV-2 virus that causes COVID-19. Institutional protocols and algorithms that pertain to the evaluation of patients at risk for COVID-19 are in a state of rapid change based on information released by regulatory bodies including the CDC and federal and state organizations. These policies and algorithms were followed during the patient's care in the ED.   Patient brought to the ED after unresponsiveness from IV drug use and unintentional overdose.  Given Narcan on scene.  Here he is breathing comfortably, 16 times a minute.  Mental status is normal.  Vital signs unremarkable.  Chest x-ray and basic lab panel also unremarkable.  Patient medically stable for discharge and outpatient follow-up.  Nurse received a call noting that [facility] has a bed and would like to evaluate him for acceptance.  Spoke with [facility] aqua who is helping to facilitate as well.  Will obtain a Covid screening test and UDS in case these are helpful to her CS as well.       ____________________________________________   FINAL CLINICAL IMPRESSION(S) / ED DIAGNOSES    Final diagnoses:  Accidental overdose of heroin, initial encounter [facility])  Polysubstance abuse [facility])     ED Discharge Orders    None      Portions of this note were generated with dragon dictation software. Dictation errors may occur despite best attempts at proofreading.   Sharman Cheek, MD 06/15/20 [phone]

## 2020-06-15 NOTE — ED Provider Notes (Signed)
-----------------------------------------   5:41 PM on 06/15/2020 -----------------------------------------  Blood pressure (!) 153/112, pulse 78, temperature (!) 96.5 F (35.8 C), temperature source Axillary, resp. rate 12, SpO2 96 %.  Assuming care from Dr. Scotty Court.  In short, [name] is a 34 y.o. male with a chief complaint of Drug Overdose .  Refer to the original H&P for additional details.  The current plan of care is to discuss potential [facility] placement with [facility].  Patient's UDS unfortunately came back positive for methamphetamines, patient subsequently declined for [facility] placement.  He was provided with outpatient substance abuse treatment resources by [facility] and is appropriate for discharge home with outpatient follow-up at this time.  Patient offered prescription for Narcan, which he accepts.  He was counseled to follow-up with [facility] and [facility] for further substance abuse treatment.    Chesley Noon, MD 06/15/20 1743

## 2020-06-15 NOTE — BH Assessment (Signed)
TTS faxed requested information to Robert ([facility]).  TTS is currently awaiting a call for confirmation and decision.

## 2020-06-15 NOTE — BH Assessment (Signed)
TTS was contacted by Lorella Nimrod ([facility]) reporting pt's need to complete a phone screening with [facility] for a potential bed assignment for later this afternoon. TTS agreed to assist pt in completing the task.   TTS made contact with Molly Maduro ([facility]) who is currently requesting pt's lab work and agreed to complete a brief screening with pt now until lab results are in. Molly Maduro reports to be unable to make a final decision until lab results are received.   TTS updated pt's current RN Tresa Endo) and MD Scotty Court).   Pt completed screening. Robert contacted [facility] reporting concern with pt's receiving a bed assignment if he test positive for methamphetamine. Pt's bed assignment with [facility] is currently pending his lab results.   TTS will keep Molly Maduro and Lorella Nimrod updated accordingly.

## 2020-06-15 NOTE — ED Triage Notes (Signed)
Pt to ED via [facility] from home. Per EMS pt consumed a quarter gram of heroin and meth. Pt unresponsive upon [facility] arrival and given 4mg  narcan IM. Pt alert and ambulatory to ambulance. Pt given another .5mg  IV.   Upon arrival pt lethargic, tachycardic and diaphoretic. Pt able to answer questions. Pt stating he usually takes drugs PO but injected this time.

## 2020-06-15 NOTE — BH Assessment (Signed)
TTS was contacted by Susie ([facility]) who reports pt to be denied due to testing positive for meth. Susie reports pt to have the option to test and complete another screening with them in the next few days.   TTS updated Lorella Nimrod   Per Dr. Larinda Buttery pt will be discharged with the recommendation to follow up with resources provided ([facility], [facility] & Lorella Nimrod).

## 2020-09-03 ENCOUNTER — Emergency Department: Payer: Medicaid Other

## 2020-09-03 ENCOUNTER — Other Ambulatory Visit: Payer: Self-pay

## 2020-09-03 ENCOUNTER — Encounter: Payer: Self-pay | Admitting: Emergency Medicine

## 2020-09-03 ENCOUNTER — Emergency Department
Admission: EM | Admit: 2020-09-03 | Discharge: 2020-09-03 | Disposition: A | Discharge status: Home / Self Care | Payer: Medicaid Other | Attending: Emergency Medicine | Admitting: Emergency Medicine

## 2020-09-03 DIAGNOSIS — K4090 Unilateral inguinal hernia, without obstruction or gangrene, not specified as recurrent: Secondary | ICD-10-CM

## 2020-09-03 DIAGNOSIS — K4091 Unilateral inguinal hernia, without obstruction or gangrene, recurrent: Secondary | ICD-10-CM | POA: Insufficient documentation

## 2020-09-03 DIAGNOSIS — Z79899 Other long term (current) drug therapy: Secondary | ICD-10-CM | POA: Diagnosis not present

## 2020-09-03 DIAGNOSIS — J45909 Unspecified asthma, uncomplicated: Secondary | ICD-10-CM | POA: Diagnosis not present

## 2020-09-03 DIAGNOSIS — F17210 Nicotine dependence, cigarettes, uncomplicated: Secondary | ICD-10-CM | POA: Insufficient documentation

## 2020-09-03 DIAGNOSIS — R1909 Other intra-abdominal and pelvic swelling, mass and lump: Secondary | ICD-10-CM

## 2020-09-03 NOTE — Discharge Instructions (Addendum)
Follow discharge care instruction call surgical clinic to schedule appointment for definitive evaluation and treatment.

## 2020-09-03 NOTE — ED Triage Notes (Signed)
C/O right groin hernia x 6 months.  AAOx3.  Skin warm and dry. NAD.  Ambulates with easy and steady gait.  NAD

## 2020-09-03 NOTE — ED Provider Notes (Signed)
[facility] Emergency Department Provider Note   ____________________________________________   Event Date/Time   First MD Initiated Contact with Patient 09/03/20 [phone]     (approximate)  I have reviewed the triage vital signs and the nursing notes.   HISTORY  Chief Complaint No chief complaint on file.    HPI [name] is a 35 y.o. male patient complain right inguinal mass for 6 months.  Patient states lesion is increased in size.  Patient state intimating pain with sexual activity.  Denies scrotal pain.  Denies urinary complaint.  States intermitting pain which he rates as a 6/10. Patient states nodules most notable when sitting on the toilet with bowel movements. Patient also has mild discomfort in this area with sexual intercourse. Denies bladder or bowel dysfunction. Describes pain as "achy".  No palliative measure for complaint.         Past Medical History:  Diagnosis Date  . Asthma     There are no problems to display for this patient.   Past Surgical History:  Procedure Laterality Date  . HIP CLOSED REDUCTION    . HIP CLOSED REDUCTION N/A 06/17/2019   Procedure: CLOSED REDUCTION HIP;  Surgeon: Christena Flake, MD;  Location: [facility] ORS;  Service: Orthopedics;  Laterality: N/A;  . HIP CLOSED REDUCTION Right 07/02/2019   Procedure: CLOSED REDUCTION HIP;  Surgeon: Kennedy Bucker, MD;  Location: [facility] OR[facility];  Service: Orthopedics;  Laterality: Right;    Prior to Admission medications   Medication Sig Start Date End Date Taking? Authorizing Provider  naloxone [facility]) nasal spray 4 mg/0.1 mL Use as needed for opiate overdose 06/15/20   Chesley Noon, MD    Allergies Patient has no known allergies.  No family history on file.  Social History Social History   Tobacco Use  . Smoking status: Current Every Day Smoker    Packs/day: 0.50  . Smokeless tobacco: Never Used  Substance Use Topics  . Alcohol use: Never  . Drug use: Never     Review of Systems Constitutional: No fever/chills Eyes: No visual changes. ENT: No sore throat. Cardiovascular: Denies chest pain. Respiratory: Denies shortness of breath. Gastrointestinal: No abdominal pain.  No nausea, no vomiting.  No diarrhea.  No constipation. Right inguinal hernia. Genitourinary: Negative for dysuria. Musculoskeletal: Negative for back pain. Skin: Negative for rash. Neurological: Negative for headaches, focal weakness or numbness.   ____________________________________________   PHYSICAL EXAM:  VITAL SIGNS: ED Triage Vitals  Enc Vitals Group     BP 09/03/20 0733 (!) 154/95     Pulse Rate 09/03/20 0733 100     Resp 09/03/20 0733 17     Temp 09/03/20 0733 98.6 F (37 C)     Temp Source 09/03/20 0733 Oral     SpO2 09/03/20 0733 100 %     Weight 09/03/20 0732 169 lb 15.6 oz (77.1 kg)     Height 09/03/20 0732 6' (1.829 m)     Head Circumference --      Peak Flow --      Pain Score 09/03/20 0732 6     Pain Loc --      Pain Edu? --      Excl. in GC? --    Constitutional: Alert and oriented. Well appearing and in no acute distress. Cardiovascular: Normal rate, regular rhythm. Grossly normal heart sounds.  Good peripheral circulation. Respiratory: Normal respiratory effort.  No retractions. Lungs CTAB. Gastrointestinal: Soft and nontender. Right inguinal reducible mass. No  abdominal bruits. No CVA tenderness. Musculoskeletal: No lower extremity tenderness nor edema.  No joint effusions. Neurologic:  Normal speech and language. No gross focal neurologic deficits are appreciated. No gait instability. Skin:  Skin is warm, dry and intact. No rash noted. Psychiatric: Mood and affect are normal. Speech and behavior are normal.  ____________________________________________   LABS (all labs ordered are listed, but only abnormal results are displayed)  Labs Reviewed - No data to  display ____________________________________________  EKG   ____________________________________________  RADIOLOGY I, Joni Reining, personally viewed and evaluated these images (plain radiographs) as part of my medical decision making, as well as reviewing the written report by the radiologist.  ED MD interpretation:    Official radiology report(s): US PELVIS LIMITED (TRANSABDOMINAL ONLY)  Result Date: 09/03/2020 CLINICAL DATA:  35 year old with a right groin mass. EXAM: LIMITED ULTRASOUND OF PELVIS TECHNIQUE: Limited transabdominal ultrasound examination of the pelvis was performed. COMPARISON:  None. FINDINGS: Images were obtained over the palpable area in the right groin. Evidence for hernia at the area of concern. This hernia sac roughly measures 3.7 x 2.2 cm. Structures moving within the hernia sac are most compatible with bowel. There is trace fluid within this hernia sac. IMPRESSION: The palpable area in the right groin appears to represent a hernia sac containing bowel. There may be trace fluid within the sac. This area could be further characterized with CT if needed. Electronically Signed   By: Richarda Overlie M.D.   On: 09/03/2020 09:07    ____________________________________________   PROCEDURES  Procedure(s) performed (including Critical Care):  Procedures   ____________________________________________   INITIAL IMPRESSION / ASSESSMENT AND PLAN / ED COURSE  As part of my medical decision making, I reviewed the following data within the electronic MEDICAL RECORD NUMBER         Patient presents with a right inguinal mass that has increased in the past 6 months. Patient states mass is only visible when standing. Patient also has noticed a mass when having bowel movements. Discussed ultrasound findings with patient. Patient will follow up with surgical clinic for definitive evaluation and treatment.      ____________________________________________   FINAL CLINICAL  IMPRESSION(S) / ED DIAGNOSES  Final diagnoses:  Right inguinal hernia     ED Discharge Orders    None      *Please note:  [name] was evaluated in Emergency Department on 09/03/2020 for the symptoms described in the history of present illness. He was evaluated in the context of the global COVID-19 pandemic, which necessitated consideration that the patient might be at risk for infection with the SARS-CoV-2 virus that causes COVID-19. Institutional protocols and algorithms that pertain to the evaluation of patients at risk for COVID-19 are in a state of rapid change based on information released by regulatory bodies including the CDC and federal and state organizations. These policies and algorithms were followed during the patient's care in the ED.  Some ED evaluations and interventions may be delayed as a result of limited staffing during and the pandemic.*   Note:  This document was prepared using Dragon voice recognition software and may include unintentional dictation errors.    Joni Reining, PA-C 09/03/20 0950    Concha Se, MD 09/03/20 1004

## 2020-09-08 ENCOUNTER — Ambulatory Visit (INDEPENDENT_AMBULATORY_CARE_PROVIDER_SITE_OTHER): Payer: 59 | Admitting: General Surgery

## 2020-09-08 ENCOUNTER — Other Ambulatory Visit: Payer: Self-pay

## 2020-09-08 ENCOUNTER — Encounter: Payer: Self-pay | Admitting: General Surgery

## 2020-09-08 VITALS — BP 140/90 | HR 120 | Temp 98.7°F | Ht 72.0 in | Wt 165.8 lb

## 2020-09-08 DIAGNOSIS — K4090 Unilateral inguinal hernia, without obstruction or gangrene, not specified as recurrent: Secondary | ICD-10-CM | POA: Diagnosis not present

## 2020-09-08 NOTE — H&P (View-Only) (Signed)
Patient ID: [name], male   DOB: 07-05-1986, 35 y.o.   MRN: [id number]  Chief Complaint  Patient presents with  . New Patient (Initial Visit)    Right inguinal hernia     HPI [name] is a 35 y.o. male.  He is here today as follow-up from a recent emergency department visit on September 03, 2020.  I have copied the emergency department provider's history of present illness here:  "[name] is a 35 y.o. male patient complain right inguinal mass for 6 months.  Patient states lesion is increased in size.  Patient state intimating pain with sexual activity.  Denies scrotal pain.  Denies urinary complaint.  States intermitting pain which he rates as a 6/10. Patient states nodules most notable when sitting on the toilet with bowel movements. Patient also has mild discomfort in this area with sexual intercourse. Denies bladder or bowel dysfunction. Describes pain as "achy".  No palliative measure for complaint."  An ultrasound performed during that evaluation was consistent with a right inguinal hernia.  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  He said his most painful when he is standing or engages in physical activity.  He says it goes down on its own when he is supine.  He denies any issues with bowel movements or urination.  No nausea or vomiting.  No significant history of constipation.  He denies any family history of hernia disease.  He is interested in surgical repair.   Past Medical History:  Diagnosis Date  . Asthma   . Substance abuse [facility])     Past Surgical History:  Procedure Laterality Date  . COCCYX REMOVAL    . HIP CLOSED REDUCTION    . HIP CLOSED REDUCTION N/A 06/17/2019   Procedure: CLOSED REDUCTION HIP;  Surgeon: Christena Flake, MD;  Location: [facility] ORS;  Service: Orthopedics;  Laterality: N/A;  . HIP CLOSED REDUCTION Right 07/02/2019   Procedure: CLOSED REDUCTION HIP;  Surgeon: Kennedy Bucker,  MD;  Location: [facility] ORS;  Service: Orthopedics;  Laterality: Right;  . 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 No pertinent family history.  Social History Social History   Tobacco Use  . Smoking status: Current Every Day Smoker    Packs/day: 0.50  . Smokeless tobacco: Never Used  Substance Use Topics  . Alcohol use: Never  . Drug use: Never    No Known Allergies  No current outpatient medications on file.   No current facility-administered medications for this visit.    Review of Systems Review of Systems  Eyes: Positive for visual disturbance.  Genitourinary: Positive for frequency.  Neurological: Positive for headaches.  All other systems reviewed and are negative.   Blood pressure 140/90, pulse (!) 120, temperature 98.7 F (37.1 C), temperature source Oral, height 6' (1.829 m), weight 165 lb 12.8 oz (75.2 kg), SpO2 95 %.  Physical Exam Physical Exam Vitals reviewed. Exam conducted with a chaperone present.  Constitutional:      General: He is not in acute distress.    Appearance: Normal appearance. He is normal weight.  HENT:     Head: Normocephalic and atraumatic.     Nose:     Comments: Covered with a mask    Mouth/Throat:     Comments: Covered with a mask Eyes:     General: No scleral icterus.  Right eye: No discharge.        Left eye: No discharge.     Conjunctiva/sclera: Conjunctivae normal.  Cardiovascular:     Rate and Rhythm: Regular rhythm. Tachycardia present.  Pulmonary:     Effort: Pulmonary effort is normal. No respiratory distress.     Breath sounds: Normal breath sounds.  Abdominal:     General: Abdomen is flat. Bowel sounds are normal.     Palpations: Abdomen is soft.  Genitourinary:      Comments: There is a right inguinal hernia present.  I am unable to palpate a left sided defect. Musculoskeletal:     Cervical back: Normal range of motion. No rigidity.     Right lower leg: No  edema.     Left lower leg: No edema.  Lymphadenopathy:     Cervical: No cervical adenopathy.  Skin:    General: Skin is warm and dry.  Neurological:     General: No focal deficit present.     Mental Status: He is alert and oriented to person, place, and time.  Psychiatric:        Behavior: Behavior normal.     Data Reviewed Via the electronic medical record, I reviewed the recent emergency department visit for his hernia.  I also personally reviewed the ultrasound that was performed at that time and I concur with the radiology interpretation which is copied here:  CLINICAL DATA:  34-year-old with a right groin mass.  EXAM: LIMITED ULTRASOUND OF PELVIS  TECHNIQUE: Limited transabdominal ultrasound examination of the pelvis was performed.  COMPARISON:  None.  FINDINGS: Images were obtained over the palpable area in the right groin. Evidence for hernia at the area of concern. This hernia sac roughly measures 3.7 x 2.2 cm. Structures moving within the hernia sac are most compatible with bowel. There is trace fluid within this hernia sac.  IMPRESSION: The palpable area in the right groin appears to represent a hernia sac containing bowel. There may be trace fluid within the sac. This area could be further characterized with CT if needed.  Additional emergency department notes were reviewed.  He has had several presentations for polysubstance abuse/overdose.  These typically involve amphetamines and opiates and occasionally cannabinoids.  Assessment This is a 34-year-old man with a symptomatic right inguinal hernia.  I have offered him surgical repair with a robot assisted laparoscopic inguinal hernia repair.  I also indicated to him that during the procedure, I would evaluate the left side and if there is a hernia defect seen in this location, I would repair that as a concomitant procedure.  Plan I have explained the procedure, risks, and aftercare of inguinal hernia  repair to [name].   Risks include but are not limited to bleeding, infection, wound problems, anesthesia, recurrence, bladder or intestine injury, urinary retention, testicular dysfunction, chronic pain, mesh problems.  He  seems to understand and agrees to proceed.  Questions were answered to his stated satisfaction.  Due to his history of substance abuse, he will require urine drug screen the morning of surgery.  We will work on getting him scheduled.    Nile Dorning 09/08/2020, 10:44 AM   

## 2020-09-08 NOTE — Patient Instructions (Addendum)
Our surgery scheduler will call you in the next 24-48 hours. Please be sure to have the blue surgery sheet available when she calls. Inguinal Hernia, Adult An inguinal hernia is when fat or your intestines push through a weak spot in a muscle where your leg meets your lower belly (groin). This causes a bulge. This kind of hernia could also be:  In your scrotum, if you are male.  In folds of skin around your vagina, if you are male. There are three types of inguinal hernias:  Hernias that can be pushed back into the belly (are reducible). This type rarely causes pain.  Hernias that cannot be pushed back into the belly (are incarcerated).  Hernias that cannot be pushed back into the belly and lose their blood supply (are strangulated). This type needs emergency surgery. What are the causes? This condition is caused by having a weak spot in the muscles or tissues in your groin. This develops over time. The hernia may poke through the weak spot when you strain your lower belly muscles all of a sudden, such as when you:  Lift a heavy object.  Strain to poop (have a bowel movement). Trouble pooping (constipation) can lead to straining.  Cough. What increases the risk? This condition is more likely to develop in:  Males.  Pregnant females.  People who: ? Are overweight. ? Work in jobs that require long periods of standing or heavy lifting. ? Have had an inguinal hernia before. ? Smoke or have lung disease. These factors can lead to long-term (chronic) coughing. What are the signs or symptoms? Symptoms may depend on the size of the hernia. Often, a small hernia has no symptoms. Symptoms of a larger hernia may include:  A bulge in the groin area. This is easier to see when standing. You might not be able to see it when you are lying down.  Pain or burning in the groin. This may get worse when you lift, strain, or cough.  A dull ache or a feeling of pressure in the groin.  An  abnormal bulge in the scrotum, in males. Symptoms of a strangulated inguinal hernia may include:  A bulge in your groin that is very painful and tender to the touch.  A bulge that turns red or purple.  Fever, feeling like you may vomit (nausea), and vomiting.  Not being able to poop or to pass gas. How is this treated? Treatment depends on the size of your hernia and whether you have symptoms. If you do not have symptoms, your doctor may have you watch your hernia carefully and have you come in for follow-up visits. If your hernia is large or if you have symptoms, you may need surgery to repair the hernia. Follow these instructions at home: Lifestyle  Avoid lifting heavy objects.  Avoid standing for long amounts of time.  Do not smoke or use any products that contain nicotine or tobacco. If you need help quitting, ask your doctor.  Stay at a healthy weight. Prevent trouble pooping You may need to take these actions to prevent or treat trouble pooping:  Drink enough fluid to keep your pee (urine) pale yellow.  Take over-the-counter or prescription medicines.  Eat foods that are high in fiber. These include beans, whole grains, and fresh fruits and vegetables.  Limit foods that are high in fat and sugar. These include fried or sweet foods. General instructions  You may try to push your hernia back in place by very  gently pressing on it when you are lying down. Do not try to push the bulge back in if it will not go in easily.  Watch your hernia for any changes in shape, size, or color. Tell your doctor if you see any changes.  Take over-the-counter and prescription medicines only as told by your doctor.  Keep all follow-up visits. Contact a doctor if:  You have a fever or chills.  You have new symptoms.  Your symptoms get worse. Get help right away if:  You have pain in your groin that gets worse all of a sudden.  You have a bulge in your groin that: ? Gets bigger  all of a sudden, and it does not get smaller after that. ? Turns red or purple. ? Is painful when you touch it.  You are a male, and you have: ? Sudden pain in your scrotum. ? A sudden change in the size of your scrotum.  You cannot push the hernia back in place by very gently pressing on it when you are lying down.  You feel like you may vomit, and that feeling does not go away.  You keep vomiting.  You have a fast heartbeat.  You cannot poop or pass gas. These symptoms may be an emergency. Get help right away. Call your local emergency services (911 in the [place]  Do not wait to see if the symptoms will go away.  Do not drive yourself to the hospital. Summary  An inguinal hernia is when fat or your intestines push through a weak spot in a muscle where your leg meets your lower belly (groin). This causes a bulge.  If you do not have symptoms, you may not need treatment. If you have symptoms or a large hernia, you may need surgery.  Avoid lifting heavy objects. Also, avoid standing for long amounts of time.  Do not try to push the bulge back in if it will not go in easily. This information is not intended to replace advice given to you by your health care provider. Make sure you discuss any questions you have with your health care provider. Document Revised: 03/24/2020 Document Reviewed: 03/24/2020 Elsevier Patient Education  2021 [employer].

## 2020-09-08 NOTE — Progress Notes (Signed)
Patient ID: [name], male   DOB: 07-05-1986, 35 y.o.   MRN: [id number]  Chief Complaint  Patient presents with  . New Patient (Initial Visit)    Right inguinal hernia     HPI [name] is a 35 y.o. male.  He is here today as follow-up from a recent emergency department visit on September 03, 2020.  I have copied the emergency department provider's history of present illness here:  "[name] is a 35 y.o. male patient complain right inguinal mass for 6 months.  Patient states lesion is increased in size.  Patient state intimating pain with sexual activity.  Denies scrotal pain.  Denies urinary complaint.  States intermitting pain which he rates as a 6/10. Patient states nodules most notable when sitting on the toilet with bowel movements. Patient also has mild discomfort in this area with sexual intercourse. Denies bladder or bowel dysfunction. Describes pain as "achy".  No palliative measure for complaint."  An ultrasound performed during that evaluation was consistent with a right inguinal hernia.  Mr. 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  He said his most painful when he is standing or engages in physical activity.  He says it goes down on its own when he is supine.  He denies any issues with bowel movements or urination.  No nausea or vomiting.  No significant history of constipation.  He denies any family history of hernia disease.  He is interested in surgical repair.   Past Medical History:  Diagnosis Date  . Asthma   . Substance abuse [facility])     Past Surgical History:  Procedure Laterality Date  . COCCYX REMOVAL    . HIP CLOSED REDUCTION    . HIP CLOSED REDUCTION N/A 06/17/2019   Procedure: CLOSED REDUCTION HIP;  Surgeon: Christena Flake, MD;  Location: [facility] ORS;  Service: Orthopedics;  Laterality: N/A;  . HIP CLOSED REDUCTION Right 07/02/2019   Procedure: CLOSED REDUCTION HIP;  Surgeon: Kennedy Bucker,  MD;  Location: [facility] ORS;  Service: Orthopedics;  Laterality: Right;  . 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 No pertinent family history.  Social History Social History   Tobacco Use  . Smoking status: Current Every Day Smoker    Packs/day: 0.50  . Smokeless tobacco: Never Used  Substance Use Topics  . Alcohol use: Never  . Drug use: Never    No Known Allergies  No current outpatient medications on file.   No current facility-administered medications for this visit.    Review of Systems Review of Systems  Eyes: Positive for visual disturbance.  Genitourinary: Positive for frequency.  Neurological: Positive for headaches.  All other systems reviewed and are negative.   Blood pressure 140/90, pulse (!) 120, temperature 98.7 F (37.1 C), temperature source Oral, height 6' (1.829 m), weight 165 lb 12.8 oz (75.2 kg), SpO2 95 %.  Physical Exam Physical Exam Vitals reviewed. Exam conducted with a chaperone present.  Constitutional:      General: He is not in acute distress.    Appearance: Normal appearance. He is normal weight.  HENT:     Head: Normocephalic and atraumatic.     Nose:     Comments: Covered with a mask    Mouth/Throat:     Comments: Covered with a mask Eyes:     General: No scleral icterus.  Right eye: No discharge.        Left eye: No discharge.     Conjunctiva/sclera: Conjunctivae normal.  Cardiovascular:     Rate and Rhythm: Regular rhythm. Tachycardia present.  Pulmonary:     Effort: Pulmonary effort is normal. No respiratory distress.     Breath sounds: Normal breath sounds.  Abdominal:     General: Abdomen is flat. Bowel sounds are normal.     Palpations: Abdomen is soft.  Genitourinary:      Comments: There is a right inguinal hernia present.  I am unable to palpate a left sided defect. Musculoskeletal:     Cervical back: Normal range of motion. No rigidity.     Right lower leg: No  edema.     Left lower leg: No edema.  Lymphadenopathy:     Cervical: No cervical adenopathy.  Skin:    General: Skin is warm and dry.  Neurological:     General: No focal deficit present.     Mental Status: He is alert and oriented to person, place, and time.  Psychiatric:        Behavior: Behavior normal.     Data Reviewed Via the electronic medical record, I reviewed the recent emergency department visit for his hernia.  I also personally reviewed the ultrasound that was performed at that time and I concur with the radiology interpretation which is copied here:  CLINICAL DATA:  36 year old with a right groin mass.  EXAM: LIMITED ULTRASOUND OF PELVIS  TECHNIQUE: Limited transabdominal ultrasound examination of the pelvis was performed.  COMPARISON:  None.  FINDINGS: Images were obtained over the palpable area in the right groin. Evidence for hernia at the area of concern. This hernia sac roughly measures 3.7 x 2.2 cm. Structures moving within the hernia sac are most compatible with bowel. There is trace fluid within this hernia sac.  IMPRESSION: The palpable area in the right groin appears to represent a hernia sac containing bowel. There may be trace fluid within the sac. This area could be further characterized with CT if needed.  Additional emergency department notes were reviewed.  He has had several presentations for polysubstance abuse/overdose.  These typically involve amphetamines and opiates and occasionally cannabinoids.  Assessment This is a 35 year old man with a symptomatic right inguinal hernia.  I have offered him surgical repair with a robot assisted laparoscopic inguinal hernia repair.  I also indicated to him that during the procedure, I would evaluate the left side and if there is a hernia defect seen in this location, I would repair that as a concomitant procedure.  Plan I have explained the procedure, risks, and aftercare of inguinal hernia  repair to [employer].   Risks include but are not limited to bleeding, infection, wound problems, anesthesia, recurrence, bladder or intestine injury, urinary retention, testicular dysfunction, chronic pain, mesh problems.  He  seems to understand and agrees to proceed.  Questions were answered to his stated satisfaction.  Due to his history of substance abuse, he will require urine drug screen the morning of surgery.  We will work on getting him scheduled.    Duanne Guess 09/08/2020, 10:44 AM

## 2020-09-09 ENCOUNTER — Telehealth: Payer: Self-pay | Admitting: General Surgery

## 2020-09-09 NOTE — Telephone Encounter (Signed)
Patient has been advised of Pre-Admission date/time, COVID Testing date and Surgery date.  Surgery Date: 09/25/20 Preadmission Testing Date: 09/16/20 (phone 8a-1p) Covid Testing Date: 09/23/20 - patient advised to [facility] ([address] [facility]) between 8a-1p  Patient has been made aware to call [phone], between 1-3:00pm the day before surgery, to find out what time to arrive for surgery.

## 2020-09-16 ENCOUNTER — Encounter
Admission: RE | Admit: 2020-09-16 | Discharge: 2020-09-16 | Disposition: A | Discharge status: Home / Self Care | Payer: Medicaid Other | Source: Ambulatory Visit | Attending: General Surgery | Admitting: General Surgery

## 2020-09-16 ENCOUNTER — Other Ambulatory Visit: Payer: Self-pay

## 2020-09-16 HISTORY — DX: Gastro-esophageal reflux disease without esophagitis: K21.9

## 2020-09-16 NOTE — Patient Instructions (Signed)
Your procedure is scheduled on: 09/25/20 Report to DAY SURGERY DEPARTMENT LOCATED ON 2ND FLOOR MEDICAL MALL ENTRANCE. Must stop at Admitting desk first. To find out your arrival time please call [phone] between 1PM - 3PM on 09/24/20.  Remember: Instructions that are not followed completely may result in serious medical risk, up to and including death, or upon the discretion of your surgeon and anesthesiologist your surgery may need to be rescheduled.     _X__ 1. Do not eat food after midnight the night before your procedure.                 No gum chewing or hard candies. You may drink clear liquids up to 2 hours                 before you are scheduled to arrive for your surgery- DO not drink clear                 liquids within 2 hours of the start of your surgery.                 Clear Liquids include:  water, apple juice without pulp, clear carbohydrate                 drink such as Clearfast or Gatorade, Black Coffee or Tea (Do not add                 anything to coffee or tea). Diabetics water only  __X__2.  On the morning of surgery brush your teeth with toothpaste and water, you                 may rinse your mouth with mouthwash if you wish.  Do not swallow any              toothpaste of mouthwash.     _X__ 3.  No Alcohol for 24 hours before or after surgery.   _X__ 4.  Do Not Smoke or use e-cigarettes For 24 Hours Prior to Your Surgery.                 Do not use any chewable tobacco products for at least 6 hours prior to                 surgery.  ____  5.  Bring all medications with you on the day of surgery if instructed.   __X__  6.  Notify your doctor if there is any change in your medical condition      (cold, fever, infections).     Do not wear jewelry, make-up, hairpins, clips or nail polish. Do not wear lotions, powders, or perfumes.  Do not shave 48 hours prior to surgery. Men may shave face and neck. Do not bring valuables to the hospital.    [facility] is  not responsible for any belongings or valuables.  Contacts, dentures/partials or body piercings may not be worn into surgery. Bring a case for your contacts, glasses or hearing aids, a denture cup will be supplied. Leave your suitcase in the car. After surgery it may be brought to your room. For patients admitted to the hospital, discharge time is determined by your treatment team.   Patients discharged the day of surgery will not be allowed to drive home.   Please read over the following fact sheets that you were given:   MRSA Information, chg soap  __X__ Take these medicines the morning of  surgery with A SIP OF WATER:    1. none  2.   3.   4.  5.  6.  ____ Fleet Enema (as directed)   __X__ Use CHG Soap/SAGE wipes as directed  __X__ Use inhalers on the day of surgery  ____ Stop metformin/Janumet/Farxiga 2 days prior to surgery    ____ Take 1/2 of usual insulin dose the night before surgery. No insulin the morning          of surgery.   ____ Stop Blood Thinners Coumadin/Plavix/Xarelto/Pleta/Pradaxa/Eliquis/Effient/Aspirin  on   Or contact your Surgeon, Cardiologist or Medical Doctor regarding  ability to stop your blood thinners  __X__ Stop Anti-inflammatories 7 days before surgery such as Advil, Ibuprofen, Motrin,  BC or Goodies Powder, Naprosyn, Naproxen, Aleve, Aspirin    __X__ Stop all herbal supplements, fish oil or vitamin E until after surgery.    ____ Bring C-Pap to the hospital.

## 2020-09-23 ENCOUNTER — Other Ambulatory Visit
Admission: RE | Admit: 2020-09-23 | Discharge: 2020-09-23 | Disposition: A | Discharge status: Home / Self Care | Payer: 59 | Source: Ambulatory Visit | Attending: General Surgery | Admitting: General Surgery

## 2020-09-23 ENCOUNTER — Other Ambulatory Visit: Payer: Self-pay

## 2020-09-23 DIAGNOSIS — Z01812 Encounter for preprocedural laboratory examination: Secondary | ICD-10-CM | POA: Diagnosis present

## 2020-09-23 DIAGNOSIS — Z20822 Contact with and (suspected) exposure to covid-19: Secondary | ICD-10-CM | POA: Insufficient documentation

## 2020-09-24 LAB — SARS CORONAVIRUS 2 (TAT 6-24 HRS): SARS Coronavirus 2: NEGATIVE

## 2020-09-24 MED ORDER — CHLORHEXIDINE GLUCONATE 0.12 % MT SOLN
15.0000 mL | Freq: Once | OROMUCOSAL | Status: AC
  Administered 2020-09-25: 15 mL via OROMUCOSAL

## 2020-09-24 MED ORDER — ORAL CARE MOUTH RINSE: 15.0000 mL | Freq: Once | OROMUCOSAL | Status: AC

## 2020-09-24 MED ORDER — LACTATED RINGERS IV SOLN: INTRAVENOUS | Status: DC

## 2020-09-24 MED ORDER — FAMOTIDINE 20 MG PO TABS
20.0000 mg | ORAL_TABLET | Freq: Once | ORAL | Status: AC
  Administered 2020-09-25: 20 mg via ORAL

## 2020-09-25 ENCOUNTER — Ambulatory Visit: Payer: 59 | Admitting: Registered Nurse

## 2020-09-25 ENCOUNTER — Ambulatory Visit
Admission: RE | Admit: 2020-09-25 | Discharge: 2020-09-25 | Disposition: A | Discharge status: Home / Self Care | Payer: 59 | Attending: General Surgery | Admitting: General Surgery

## 2020-09-25 ENCOUNTER — Other Ambulatory Visit: Payer: Self-pay

## 2020-09-25 ENCOUNTER — Encounter
Admission: RE | Disposition: A | Discharge status: Home / Self Care | Payer: Self-pay | Source: Home / Self Care | Attending: General Surgery

## 2020-09-25 ENCOUNTER — Encounter: Payer: Self-pay | Admitting: General Surgery

## 2020-09-25 DIAGNOSIS — Z96641 Presence of right artificial hip joint: Secondary | ICD-10-CM | POA: Diagnosis not present

## 2020-09-25 DIAGNOSIS — K4090 Unilateral inguinal hernia, without obstruction or gangrene, not specified as recurrent: Secondary | ICD-10-CM

## 2020-09-25 DIAGNOSIS — F17210 Nicotine dependence, cigarettes, uncomplicated: Secondary | ICD-10-CM | POA: Diagnosis not present

## 2020-09-25 HISTORY — PX: XI ROBOTIC ASSISTED INGUINAL HERNIA REPAIR WITH MESH: SHX6706

## 2020-09-25 LAB — URINE DRUG SCREEN, QUALITATIVE (ARMC ONLY)
Amphetamines, Ur Screen: POSITIVE — AB
Barbiturates, Ur Screen: NOT DETECTED
Benzodiazepine, Ur Scrn: NOT DETECTED
Cannabinoid 50 Ng, Ur ~~LOC~~: POSITIVE — AB
Cocaine Metabolite,Ur ~~LOC~~: NOT DETECTED
MDMA (Ecstasy)Ur Screen: NOT DETECTED
Methadone Scn, Ur: NOT DETECTED
Opiate, Ur Screen: NOT DETECTED
Phencyclidine (PCP) Ur S: NOT DETECTED
Tricyclic, Ur Screen: NOT DETECTED

## 2020-09-25 SURGERY — REPAIR, HERNIA, INGUINAL, ROBOT-ASSISTED, LAPAROSCOPIC, USING MESH
Anesthesia: General | Laterality: Bilateral

## 2020-09-25 MED ORDER — ROCURONIUM BROMIDE 100 MG/10ML IV SOLN
INTRAVENOUS | Status: DC | PRN
  Administered 2020-09-25: 20 mg via INTRAVENOUS
  Administered 2020-09-25: 50 mg via INTRAVENOUS

## 2020-09-25 MED ORDER — DEXAMETHASONE SODIUM PHOSPHATE 10 MG/ML IJ SOLN
INTRAMUSCULAR | Status: DC | PRN
  Administered 2020-09-25: 8 mg via INTRAVENOUS

## 2020-09-25 MED ORDER — PROPOFOL 10 MG/ML IV BOLUS
INTRAVENOUS | Status: AC
  Filled 2020-09-25: qty 20

## 2020-09-25 MED ORDER — FENTANYL CITRATE (PF) 100 MCG/2ML IJ SOLN: 25.0000 ug | INTRAMUSCULAR | Status: DC | PRN

## 2020-09-25 MED ORDER — LIDOCAINE HCL (PF) 2 % IJ SOLN
INTRAMUSCULAR | Status: DC | PRN
  Administered 2020-09-25: .964 mg/kg/h via INTRADERMAL

## 2020-09-25 MED ORDER — PHENYLEPHRINE HCL (PRESSORS) 10 MG/ML IV SOLN
INTRAVENOUS | Status: DC | PRN
  Administered 2020-09-25: 250 ug via INTRAVENOUS
  Administered 2020-09-25: 100 ug via INTRAVENOUS
  Administered 2020-09-25: 150 ug via INTRAVENOUS
  Administered 2020-09-25: 100 ug via INTRAVENOUS
  Administered 2020-09-25: 200 ug via INTRAVENOUS
  Administered 2020-09-25: 100 ug via INTRAVENOUS
  Administered 2020-09-25: 150 ug via INTRAVENOUS

## 2020-09-25 MED ORDER — IBUPROFEN 800 MG PO TABS
800.0000 mg | ORAL_TABLET | Freq: Four times a day (QID) | ORAL | Status: DC | PRN
  Administered 2020-09-25: 800 mg via ORAL
  Filled 2020-09-25: qty 1

## 2020-09-25 MED ORDER — MIDAZOLAM HCL 2 MG/2ML IJ SOLN
INTRAMUSCULAR | Status: DC | PRN
  Administered 2020-09-25: 2 mg via INTRAVENOUS

## 2020-09-25 MED ORDER — IBUPROFEN 200 MG PO TABS: 800.0000 mg | ORAL_TABLET | Freq: Four times a day (QID) | ORAL | 0 refills | Status: DC | PRN

## 2020-09-25 MED ORDER — HYDROCODONE-ACETAMINOPHEN 5-325 MG PO TABS
1.0000 | ORAL_TABLET | Freq: Four times a day (QID) | ORAL | Status: DC | PRN
  Administered 2020-09-25: 1 via ORAL

## 2020-09-25 MED ORDER — CEFAZOLIN SODIUM-DEXTROSE 2-4 GM/100ML-% IV SOLN
2.0000 g | INTRAVENOUS | Status: AC
  Administered 2020-09-25: 2 g via INTRAVENOUS

## 2020-09-25 MED ORDER — PROPOFOL 10 MG/ML IV BOLUS
INTRAVENOUS | Status: DC | PRN
  Administered 2020-09-25: 200 mg via INTRAVENOUS

## 2020-09-25 MED ORDER — ACETAMINOPHEN 500 MG PO TABS: 1000.0000 mg | ORAL_TABLET | Freq: Four times a day (QID) | ORAL | 0 refills | Status: AC | PRN

## 2020-09-25 MED ORDER — CELECOXIB 200 MG PO CAPS
200.0000 mg | ORAL_CAPSULE | ORAL | Status: AC
  Administered 2020-09-25: 200 mg via ORAL

## 2020-09-25 MED ORDER — FENTANYL CITRATE (PF) 100 MCG/2ML IJ SOLN
INTRAMUSCULAR | Status: AC
  Filled 2020-09-25: qty 2

## 2020-09-25 MED ORDER — EPHEDRINE SULFATE 50 MG/ML IJ SOLN
INTRAMUSCULAR | Status: DC | PRN
  Administered 2020-09-25: 10 mg via INTRAVENOUS
  Administered 2020-09-25: 5 mg via INTRAVENOUS
  Administered 2020-09-25: 10 mg via INTRAVENOUS
  Administered 2020-09-25: 15 mg via INTRAVENOUS

## 2020-09-25 MED ORDER — IBUPROFEN 800 MG PO TABS
ORAL_TABLET | ORAL | Status: AC
  Filled 2020-09-25: qty 1

## 2020-09-25 MED ORDER — KETAMINE HCL 10 MG/ML IJ SOLN
INTRAMUSCULAR | Status: DC | PRN
  Administered 2020-09-25: 20 mg via INTRAVENOUS
  Administered 2020-09-25: 10 mg via INTRAVENOUS
  Administered 2020-09-25: 20 mg via INTRAVENOUS

## 2020-09-25 MED ORDER — ACETAMINOPHEN 500 MG PO TABS
ORAL_TABLET | ORAL | Status: AC
  Filled 2020-09-25: qty 2

## 2020-09-25 MED ORDER — LIDOCAINE-EPINEPHRINE 1 %-1:100000 IJ SOLN
INTRAMUSCULAR | Status: AC
  Filled 2020-09-25: qty 1

## 2020-09-25 MED ORDER — SUGAMMADEX SODIUM 200 MG/2ML IV SOLN
INTRAVENOUS | Status: DC | PRN
  Administered 2020-09-25: 200 mg via INTRAVENOUS

## 2020-09-25 MED ORDER — MIDAZOLAM HCL 2 MG/2ML IJ SOLN
INTRAMUSCULAR | Status: AC
  Filled 2020-09-25: qty 2

## 2020-09-25 MED ORDER — CHLORHEXIDINE GLUCONATE CLOTH 2 % EX PADS
6.0000 | MEDICATED_PAD | Freq: Once | CUTANEOUS | Status: AC
  Administered 2020-09-25: 6 via TOPICAL

## 2020-09-25 MED ORDER — GLYCOPYRROLATE 0.2 MG/ML IJ SOLN
INTRAMUSCULAR | Status: DC | PRN
  Administered 2020-09-25: .1 mg via INTRAVENOUS

## 2020-09-25 MED ORDER — LIDOCAINE-EPINEPHRINE 1 %-1:100000 IJ SOLN
INTRAMUSCULAR | Status: DC | PRN
  Administered 2020-09-25: 10 mL via INTRAMUSCULAR

## 2020-09-25 MED ORDER — ONDANSETRON HCL 4 MG/2ML IJ SOLN
INTRAMUSCULAR | Status: AC
  Filled 2020-09-25: qty 2

## 2020-09-25 MED ORDER — CEFAZOLIN SODIUM-DEXTROSE 2-4 GM/100ML-% IV SOLN
INTRAVENOUS | Status: AC
  Filled 2020-09-25: qty 100

## 2020-09-25 MED ORDER — CHLORHEXIDINE GLUCONATE 0.12 % MT SOLN
OROMUCOSAL | Status: AC
  Filled 2020-09-25: qty 15

## 2020-09-25 MED ORDER — HYDROCODONE-ACETAMINOPHEN 5-325 MG PO TABS: 1.0000 | ORAL_TABLET | Freq: Four times a day (QID) | ORAL | 0 refills | Status: DC | PRN

## 2020-09-25 MED ORDER — LIDOCAINE HCL (PF) 2 % IJ SOLN
INTRAMUSCULAR | Status: AC
  Filled 2020-09-25: qty 5

## 2020-09-25 MED ORDER — DEXMEDETOMIDINE HCL 200 MCG/2ML IV SOLN
INTRAVENOUS | Status: DC | PRN
  Administered 2020-09-25: 4 ug via INTRAVENOUS
  Administered 2020-09-25: 8 ug via INTRAVENOUS
  Administered 2020-09-25 (×2): 4 ug via INTRAVENOUS

## 2020-09-25 MED ORDER — DEXAMETHASONE SODIUM PHOSPHATE 10 MG/ML IJ SOLN
INTRAMUSCULAR | Status: AC
  Filled 2020-09-25: qty 1

## 2020-09-25 MED ORDER — CELECOXIB 200 MG PO CAPS
ORAL_CAPSULE | ORAL | Status: AC
  Filled 2020-09-25: qty 1

## 2020-09-25 MED ORDER — DEXMEDETOMIDINE (PRECEDEX) IN NS 20 MCG/5ML (4 MCG/ML) IV SYRINGE
PREFILLED_SYRINGE | INTRAVENOUS | Status: AC
  Filled 2020-09-25: qty 5

## 2020-09-25 MED ORDER — ROCURONIUM BROMIDE 10 MG/ML (PF) SYRINGE
PREFILLED_SYRINGE | INTRAVENOUS | Status: AC
  Filled 2020-09-25: qty 10

## 2020-09-25 MED ORDER — CHLORHEXIDINE GLUCONATE CLOTH 2 % EX PADS: 6.0000 | MEDICATED_PAD | Freq: Once | CUTANEOUS | Status: DC

## 2020-09-25 MED ORDER — HYDROCODONE-ACETAMINOPHEN 5-325 MG PO TABS
ORAL_TABLET | ORAL | Status: AC
  Filled 2020-09-25: qty 1

## 2020-09-25 MED ORDER — GLYCOPYRROLATE 0.2 MG/ML IJ SOLN
INTRAMUSCULAR | Status: AC
  Filled 2020-09-25: qty 1

## 2020-09-25 MED ORDER — ONDANSETRON HCL 4 MG/2ML IJ SOLN: 4.0000 mg | Freq: Once | INTRAMUSCULAR | Status: DC | PRN

## 2020-09-25 MED ORDER — ONDANSETRON HCL 4 MG/2ML IJ SOLN
INTRAMUSCULAR | Status: DC | PRN
  Administered 2020-09-25: 4 mg via INTRAVENOUS

## 2020-09-25 MED ORDER — FENTANYL CITRATE (PF) 100 MCG/2ML IJ SOLN
INTRAMUSCULAR | Status: DC | PRN
  Administered 2020-09-25: 25 ug via INTRAVENOUS
  Administered 2020-09-25 (×2): 12.5 ug via INTRAVENOUS
  Administered 2020-09-25: 25 ug via INTRAVENOUS

## 2020-09-25 MED ORDER — GABAPENTIN 300 MG PO CAPS
ORAL_CAPSULE | ORAL | Status: AC
  Filled 2020-09-25: qty 1

## 2020-09-25 MED ORDER — BUPIVACAINE HCL (PF) 0.25 % IJ SOLN
INTRAMUSCULAR | Status: AC
  Filled 2020-09-25: qty 30

## 2020-09-25 MED ORDER — GABAPENTIN 300 MG PO CAPS
300.0000 mg | ORAL_CAPSULE | ORAL | Status: AC
  Administered 2020-09-25: 300 mg via ORAL

## 2020-09-25 MED ORDER — KETAMINE HCL 50 MG/5ML IJ SOSY
PREFILLED_SYRINGE | INTRAMUSCULAR | Status: AC
  Filled 2020-09-25: qty 5

## 2020-09-25 MED ORDER — ACETAMINOPHEN 500 MG PO TABS
1000.0000 mg | ORAL_TABLET | ORAL | Status: AC
  Administered 2020-09-25: 1000 mg via ORAL

## 2020-09-25 MED ORDER — LIDOCAINE HCL (PF) 2 % IJ SOLN
INTRAMUSCULAR | Status: AC
  Filled 2020-09-25: qty 10

## 2020-09-25 MED ORDER — LIDOCAINE HCL (CARDIAC) PF 100 MG/5ML IV SOSY
PREFILLED_SYRINGE | INTRAVENOUS | Status: DC | PRN
  Administered 2020-09-25: 80 mg via INTRAVENOUS

## 2020-09-25 MED ORDER — FAMOTIDINE 20 MG PO TABS
ORAL_TABLET | ORAL | Status: AC
  Filled 2020-09-25: qty 1

## 2020-09-25 SURGICAL SUPPLY — 52 items
ADH SKN CLS APL DERMABOND .7 (GAUZE/BANDAGES/DRESSINGS) ×1
APL PRP STRL LF DISP 70% ISPRP (MISCELLANEOUS) ×1
BAG INFUSER PRESSURE 100CC (MISCELLANEOUS) IMPLANT
BLADE SURG SZ11 CARB STEEL (BLADE) ×2 IMPLANT
CANISTER SUCT 1200ML W/VALVE (MISCELLANEOUS) IMPLANT
CHLORAPREP W/TINT 26 (MISCELLANEOUS) ×2 IMPLANT
COVER TIP SHEARS 8 DVNC (MISCELLANEOUS) ×1 IMPLANT
COVER TIP SHEARS 8MM DA VINCI (MISCELLANEOUS) ×1
COVER WAND RF STERILE (DRAPES) ×4 IMPLANT
DEFOGGER SCOPE WARMER CLEARIFY (MISCELLANEOUS) ×2 IMPLANT
DERMABOND ADVANCED (GAUZE/BANDAGES/DRESSINGS) ×1
DERMABOND ADVANCED .7 DNX12 (GAUZE/BANDAGES/DRESSINGS) ×1 IMPLANT
DRAPE ARM DVNC X/XI (DISPOSABLE) ×3 IMPLANT
DRAPE COLUMN DVNC XI (DISPOSABLE) ×1 IMPLANT
DRAPE DA VINCI XI ARM (DISPOSABLE) ×3
DRAPE DA VINCI XI COLUMN (DISPOSABLE) ×1
ELECT CAUTERY BLADE TIP 2.5 (TIP) ×2
ELECT REM PT RETURN 9FT ADLT (ELECTROSURGICAL) ×2
ELECTRODE CAUTERY BLDE TIP 2.5 (TIP) ×1 IMPLANT
ELECTRODE REM PT RTRN 9FT ADLT (ELECTROSURGICAL) ×1 IMPLANT
GLOVE SURG ENC MOIS LTX SZ6.5 (GLOVE) ×6 IMPLANT
GLOVE SURG UNDER POLY LF SZ7 (GLOVE) ×6 IMPLANT
GOWN STRL REUS W/ TWL LRG LVL3 (GOWN DISPOSABLE) ×3 IMPLANT
GOWN STRL REUS W/TWL LRG LVL3 (GOWN DISPOSABLE) ×6
IRRIGATOR SUCT 8 DISP DVNC XI (IRRIGATION / IRRIGATOR) IMPLANT
IRRIGATOR SUCTION 8MM XI DISP (IRRIGATION / IRRIGATOR)
IV NS 1000ML (IV SOLUTION)
IV NS 1000ML BAXH (IV SOLUTION) IMPLANT
KIT PINK PAD W/HEAD ARE REST (MISCELLANEOUS) ×2
KIT PINK PAD W/HEAD ARM REST (MISCELLANEOUS) ×1 IMPLANT
LABEL OR SOLS (LABEL) IMPLANT
MANIFOLD NEPTUNE II (INSTRUMENTS) ×2 IMPLANT
MESH 3DMAX LIGHT 4.1X6.2 RT LR (Mesh General) ×2 IMPLANT
NEEDLE HYPO 22GX1.5 SAFETY (NEEDLE) ×2 IMPLANT
NEEDLE INSUFFLATION 14GA 120MM (NEEDLE) ×2 IMPLANT
NS IRRIG 500ML POUR BTL (IV SOLUTION) ×2 IMPLANT
OBTURATOR OPTICAL STANDARD 8MM (TROCAR) ×1
OBTURATOR OPTICAL STND 8 DVNC (TROCAR) ×1
OBTURATOR OPTICALSTD 8 DVNC (TROCAR) ×1 IMPLANT
PACK LAP CHOLECYSTECTOMY (MISCELLANEOUS) ×2 IMPLANT
PENCIL ELECTRO HAND CTR (MISCELLANEOUS) ×2 IMPLANT
SEAL CANN UNIV 5-8 DVNC XI (MISCELLANEOUS) ×3 IMPLANT
SEAL XI 5MM-8MM UNIVERSAL (MISCELLANEOUS) ×3
SET TUBE SMOKE EVAC HIGH FLOW (TUBING) ×2 IMPLANT
SOLUTION ELECTROLUBE (MISCELLANEOUS) ×2 IMPLANT
STRIP CLOSURE SKIN 1/2X4 (GAUZE/BANDAGES/DRESSINGS) ×2 IMPLANT
SUT MNCRL 4-0 (SUTURE) ×2
SUT MNCRL 4-0 27XMFL (SUTURE) ×1
SUT VLOC 90 S/L VL9 GS22 (SUTURE) ×4 IMPLANT
SUTURE MNCRL 4-0 27XMF (SUTURE) ×1 IMPLANT
TAPE TRANSPORE STRL 2 31045 (GAUZE/BANDAGES/DRESSINGS) ×2 IMPLANT
TRAY FOLEY MTR SLVR 16FR STAT (SET/KITS/TRAYS/PACK) ×2 IMPLANT

## 2020-09-25 NOTE — Interval H&P Note (Signed)
History and Physical Interval Note:  09/25/2020 7:17 AM  [name]  has presented today for surgery, with the diagnosis of right inguinal hernia.  The various methods of treatment have been discussed with the patient and family. After consideration of risks, benefits and other options for treatment, the patient has consented to  Procedure(s): XI ROBOTIC ASSISTED INGUINAL HERNIA REPAIR WITH MESH, possible bilateral (Bilateral) as a surgical intervention.  The patient's history has been reviewed, patient examined, no change in status, stable for surgery.  I have reviewed the patient's chart and labs.  Questions were answered to the patient's satisfaction.     Duanne Guess

## 2020-09-25 NOTE — Op Note (Signed)
Robot-Assisted Laparoscopic Transabdominal Inguinal Hernia Repair  Pre-operative Diagnosis: Right inguinal Hernia   Post-operative Diagnosis: Same   Procedure: robot-assisted laparoscopic repair of a right direct inguinal hernia with mesh   Surgeon: Duanne Guess, MD   Anesthesia: GETA   Findings:  There was a large direct defect on the right.  No indirect defect appreciated on either the right or the left.   Indications: The patient presents with a symptomatic right inguinal hernia   Procedure In Detail:   The patient was seen again in the holding room. The benefits, complications, treatment options, and expected outcomes were discussed with the patient. The risks of bleeding, infection, recurrence of symptoms, failure to resolve symptoms, recurrence of hernia, ischemic orchitis, chronic pain syndrome or neuroma, were discussed again. The likelihood of improving the patient's symptoms with return to their baseline status is good.  The patient and/or family concurred with the proposed plan, giving informed consent.   The patient was brought to the operating room and placed supine on the OR table.  Prior to the induction of general anesthesia, antibiotic prophylaxis was administered. VTE prophylaxis was in place. General endotracheal anesthesia was then administered and tolerated well. After the induction, the abdomen was prepped with Chloraprep and draped in the sterile fashion.  A Foley catheter was aseptically placed.  The patient wa sterilely prepped and draped in standard fashion.  A timeout was performed confirming the patient's identity, the procedure being performed, his allergies, all necessary equipment was available, and that maintenance anesthesia was adequate.   Optiview technique was used to enter the abdomen at Palmer's point.  Pneumoperitoneum was achieved without hemodynamic changes.  Three 8 mm robotic trochars were placed under direct vision.  The Optiview trocar was pulled  back into the subcutaneum.  The skin and subcutaneous tissues were infiltrated with local anesthetic prior to making the incisions and placing the trochars.  Laparoscopy revealed a fairly large direct inguinal hernia on the right; no hernia was appreciated on the left.  The robot was brought to the table and docked in the standard fashion.  No collision between the arms was observed.  The instruments were kept under direct view at all times.  We elevated the peritoneal flap on the right. The sac was reduced and dissected free from adjacent structures. We preserved the vas and the vessels. Once dissection was completed a large right-sided Bard 3D mesh was placed and secured with 3-0 Vicryl at the pubic tubercle, the superior edge of the mesh, and the lateral corner of the mesh..  Hemostasis was confirmed. There was excellent coverage of the direct, indirect and femoral spaces. The flap was closed with a V-Loc suture   Second look revealed no complications or injuries.  The robot was undocked and withdrawn from the surgical field.   The ports were then removed.  Each was closed with interrupted 3-0 Vicryl and running subcuticular 4 oh Monocryl.  The skin was cleaned.  Dermabond and Steri-Strips were applied.  The Foley catheter was removed.  The patient was awakened, extubated, and taken to the postanesthesia care unit in good condition.  EBL: Less than 2 cc  IVF: See anesthesia record  UOP: See anesthesia record  Specimens: None  Complications: None immediately apparent      Duanne Guess, MD, FACS

## 2020-09-25 NOTE — Transfer of Care (Signed)
Immediate Anesthesia Transfer of Care Note  Patient: [name]  Procedure(s) Performed: XI ROBOTIC ASSISTED INGUINAL HERNIA REPAIR WITH MESH, possible bilateral (Bilateral )  Patient Location: PACU  Anesthesia Type:General  Level of Consciousness: drowsy  Airway & Oxygen Therapy: Patient Spontanous Breathing and Patient connected to face mask oxygen  Post-op Assessment: Report given to RN and Post -op Vital signs reviewed and stable  Post vital signs: Reviewed and stable  Last Vitals:  Vitals Value Taken Time  BP    Temp 36.6 C 09/25/20 0936  Pulse 84 09/25/20 0945  Resp 23 09/25/20 0945  SpO2 100 % 09/25/20 0945  Vitals shown include unvalidated device data.  Last Pain:  Vitals:   09/25/20 0635  TempSrc: Tympanic  PainSc: 0-No pain         Complications: No complications documented.

## 2020-09-25 NOTE — Anesthesia Postprocedure Evaluation (Signed)
Anesthesia Post Note  Patient: [name]  Procedure(s) Performed: XI ROBOTIC ASSISTED INGUINAL HERNIA REPAIR WITH MESH, possible bilateral (Bilateral )  Patient location during evaluation: PACU Anesthesia Type: General Level of consciousness: awake and alert Pain management: pain level controlled Vital Signs Assessment: post-procedure vital signs reviewed and stable Respiratory status: spontaneous breathing and respiratory function stable Cardiovascular status: stable Anesthetic complications: no   No complications documented.   Last Vitals:  Vitals:   09/25/20 0635  BP: (!) 144/99  Pulse: (!) 118  Resp: 18  Temp: 36.7 C  SpO2: 100%    Last Pain:  Vitals:   09/25/20 0635  TempSrc: Tympanic  PainSc: 0-No pain                 Dalton Molesworth K

## 2020-09-25 NOTE — Anesthesia Preprocedure Evaluation (Addendum)
Anesthesia Evaluation  Patient identified by MRN, date of birth, ID band Patient awake    Reviewed: Allergy & Precautions, NPO status , Patient's Chart, lab work & pertinent test results  History of Anesthesia Complications Negative for: history of anesthetic complications  Airway Mallampati: III       Dental   Pulmonary asthma , Current Smoker,           Cardiovascular (-) hypertension(-) Past MI and (-) CHF (-) dysrhythmias (-) Valvular Problems/Murmurs     Neuro/Psych neg Seizures    GI/Hepatic Neg liver ROS, GERD  Medicated and Controlled,  Endo/Other  neg diabetes  Renal/GU negative Renal ROS     Musculoskeletal   Abdominal   Peds  Hematology   Anesthesia Other Findings   Reproductive/Obstetrics                           Anesthesia Physical Anesthesia Plan  ASA: II  Anesthesia Plan: General   Post-op Pain Management:    Induction:   PONV Risk Score and Plan:   Airway Management Planned: Oral ETT  Additional Equipment:   Intra-op Plan:   Post-operative Plan:   Informed Consent: I have reviewed the patients History and Physical, chart, labs and discussed the procedure including the risks, benefits and alternatives for the proposed anesthesia with the patient or authorized representative who has indicated his/her understanding and acceptance.       Plan Discussed with:   Anesthesia Plan Comments:         Anesthesia Quick Evaluation

## 2020-09-25 NOTE — Discharge Instructions (Addendum)
Laparoscopic Inguinal Hernia Repair, Adult, Care After The following information offers guidance on how to care for yourself after your procedure. Your health care provider may also give you more specific instructions. If you have problems or questions, contact your health care provider. What can I expect after the procedure? After the procedure, it is common to have:  Pain.  Swelling and bruising around the incision area.  Scrotal swelling, in males.  Some fluid or blood draining from your incisions. Follow these instructions at home: Medicines  Take over-the-counter and prescription medicines only as told by your health care provider.  Ask your health care provider if the medicine prescribed to you: ? Requires you to avoid driving or using machinery. ? Can cause constipation. You may need to take these actions to prevent or treat constipation:  Drink enough fluid to keep your urine pale yellow.  Take over-the-counter or prescription medicines.  Eat foods that are high in fiber, such as beans, whole grains, and fresh fruits and vegetables.  Limit foods that are high in fat and processed sugars, such as fried or sweet foods. Incision care  Follow instructions from your health care provider about how to take care of your incisions. Make sure you: ? Wash your hands with soap and water for at least 20 seconds before and after you change your bandage (dressing). If soap and water are not available, use hand sanitizer. ? Change your dressing as told by your health care provider. ? Leave stitches (sutures), skin glue, or adhesive strips in place. These skin closures may need to stay in place for 2 weeks or longer. If adhesive strip edges start to loosen and curl up, you may trim the loose edges. Do not remove adhesive strips completely unless your health care provider tells you to do that.  Check your incision area every day for signs of infection. Check for: ? More redness, swelling,  or pain. ? More fluid or blood. ? Warmth. ? Pus or a bad smell.  Wear loose, soft clothing while your incisions heal.   Managing pain and swelling If directed, put ice on the painful or swollen areas. To do this:  Put ice in a plastic bag.  Place a towel between your skin and the bag.  Leave the ice on for 20 minutes, 2-3 times a day.  Remove the ice if your skin turns bright red. This is very important. If you cannot feel pain, heat, or cold, you have a greater risk of damage to the area.   Activity  Do not lift anything that is heavier than 10 lb (4.5 kg), or the limit that you are told, until your health care provider says that it is safe.  Ask your health care provider what activities are safe for you. A lot of activity during the first week after surgery can increase pain and swelling. For 1 week after your procedure: ? Avoid activities that take a lot of effort, such as exercise or sports. ? You may walk and climb stairs as needed for daily activity, but avoid long walks or climbing stairs for exercise. General instructions  If you were given a sedative during the procedure, it can affect you for several hours. Do not drive or operate machinery until your health care provider says that it is safe.  Do not take baths, swim, or use a hot tub until your health care provider approves. Ask your health care provider if you may take showers. You may only be allowed   to take sponge baths.  Do not use any products that contain nicotine or tobacco. These products include cigarettes, chewing tobacco, and vaping devices, such as e-cigarettes. If you need help quitting, ask your health care provider.  Keep all follow-up visits. This is important. Contact a health care provider if:  You have any of these signs of infection: ? More redness, swelling, or pain around your incisions or your groin area. ? More fluid or blood coming from an incision. ? Warmth coming from an incision. ? Pus or  a bad smell coming from an incision. ? A fever or chills.  You have more swelling in your scrotum, if you are male.  You have severe pain and medicines do not help.  You have abdominal pain or swelling.  You cannot urinate or have a bowel movement.  You faint or feel dizzy.  You have nausea and vomiting. Get help right away if:  You have redness, warmth, or pain in your leg.  You have chest pain.  You have problems breathing. These symptoms may represent a serious problem that is an emergency. Do not wait to see if the symptoms will go away. Get medical help right away. Call your local emergency services (911 in the [place] Do not drive yourself to the hospital. Summary  Pain, swelling, and bruising are common after the procedure.  Check your incision area every day for signs of infection, such as more redness, swelling, or pain.  Put ice on painful or swollen areas for 20 minutes, 2-3 times a day. This information is not intended to replace advice given to you by your health care provider. Make sure you discuss any questions you have with your health care provider. Document Revised: 03/24/2020 Document Reviewed: 03/24/2020 Elsevier Patient Education  2021 [employer]  AMBULATORY SURGERY  DISCHARGE INSTRUCTIONS   1) The drugs that you were given will stay in your system until tomorrow so for the next 24 hours you should not:  A) Drive an automobile B) Make any legal decisions C) Drink any alcoholic beverage   2) You may resume regular meals tomorrow.  Today it is better to start with liquids and gradually work up to solid foods.  You may eat anything you prefer, but it is better to start with liquids, then soup and crackers, and gradually work up to solid foods.   3) Please notify your doctor immediately if you have any unusual bleeding, trouble breathing, redness and pain at the surgery site, drainage, fever, or pain not relieved by medication.    4) Additional  Instructions:        Please contact your physician with any problems or Same Day Surgery at [phone], Monday through Friday 6 am to 4 pm, or [facility] number at [phone]. 

## 2020-09-25 NOTE — Anesthesia Procedure Notes (Signed)
Procedure Name: Intubation Date/Time: 09/25/2020 7:39 AM Performed by: Lynden Oxford, CRNA Pre-anesthesia Checklist: Patient identified, Emergency Drugs available, Suction available and Patient being monitored Patient Re-evaluated:Patient Re-evaluated prior to induction Oxygen Delivery Method: Circle system utilized Preoxygenation: Pre-oxygenation with 100% oxygen Induction Type: IV induction Ventilation: Mask ventilation without difficulty Laryngoscope Size: McGraph and 4 Grade View: Grade I Tube type: Oral Tube size: 7.5 mm Number of attempts: 1 Airway Equipment and Method: Stylet and Video-laryngoscopy Placement Confirmation: ETT inserted through vocal cords under direct vision,  positive ETCO2 and breath sounds checked- equal and bilateral Secured at: 23 cm Tube secured with: Tape Dental Injury: Teeth and Oropharynx as per pre-operative assessment

## 2020-09-28 ENCOUNTER — Other Ambulatory Visit: Payer: Self-pay | Admitting: General Surgery

## 2020-09-29 ENCOUNTER — Encounter: Payer: Self-pay | Admitting: General Surgery

## 2020-10-09 ENCOUNTER — Encounter: Payer: 59 | Admitting: Physician Assistant

## 2021-04-30 ENCOUNTER — Encounter: Payer: Self-pay | Admitting: General Surgery

## 2021-06-14 ENCOUNTER — Encounter (INDEPENDENT_AMBULATORY_CARE_PROVIDER_SITE_OTHER): Payer: Self-pay

## 2021-12-01 IMAGING — CT CT HEAD W/O CM
4 series · 17 of 47 positions shown, 19 images · non-contrast
Comparison: 03/21/2009

CLINICAL DATA: Somnolence, possible overdose, diaphoresis

EXAM:
CT HEAD WITHOUT CONTRAST
TECHNIQUE: Contiguous axial images were obtained from the base of the skull
through the vertex without intravenous contrast.

[Series 2: head wo · axial · 0.42mm/px · z∈[+277,+377]mm · 6 of 30 slices shown, 8 images]
[im 5/30  brain]
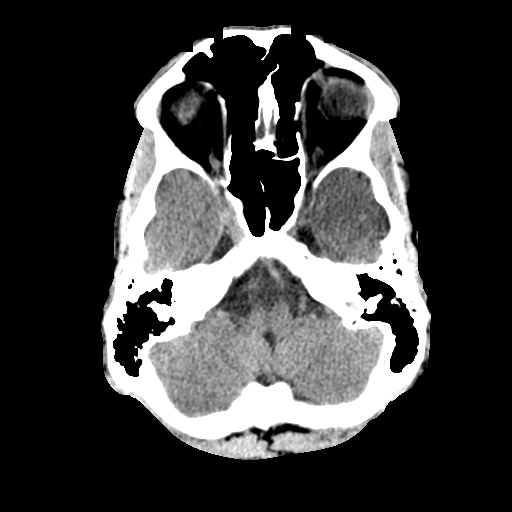
[im 5/30  bone]
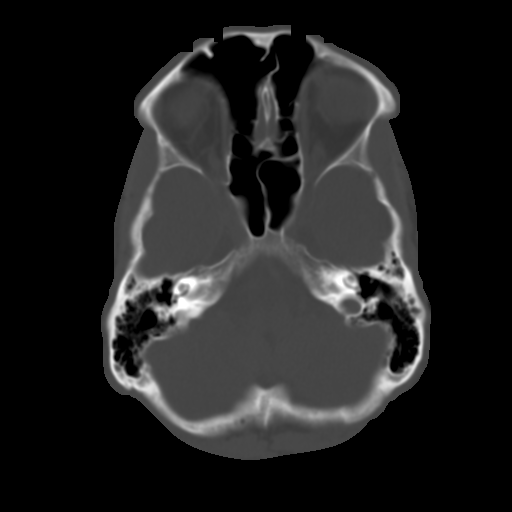
[im 9/30  brain]
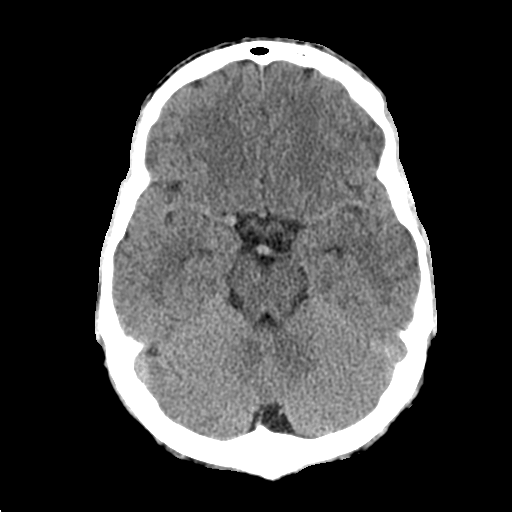
[im 13/30  brain]
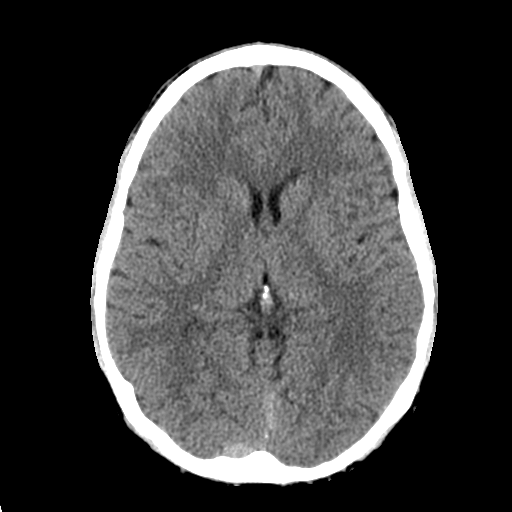
[im 17/30  brain]
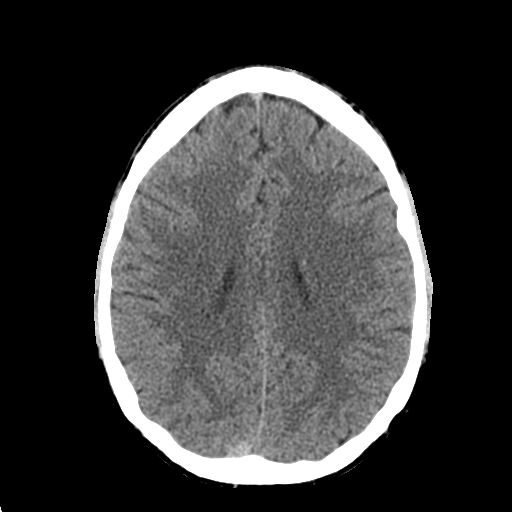
[im 21/30  brain]
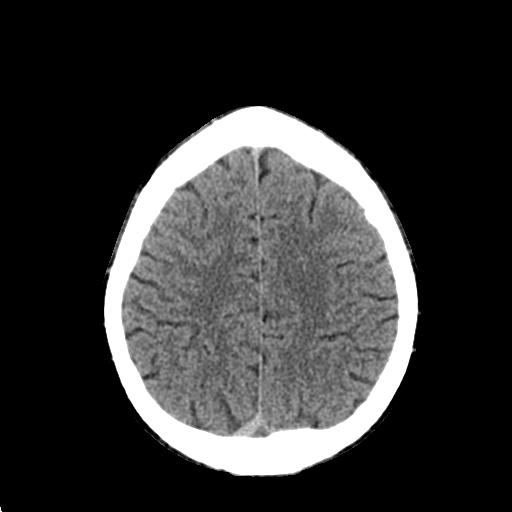
[im 21/30  bone]
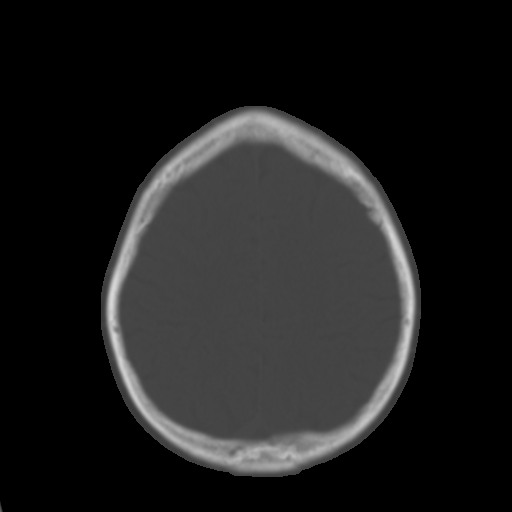
[im 25/30  brain]
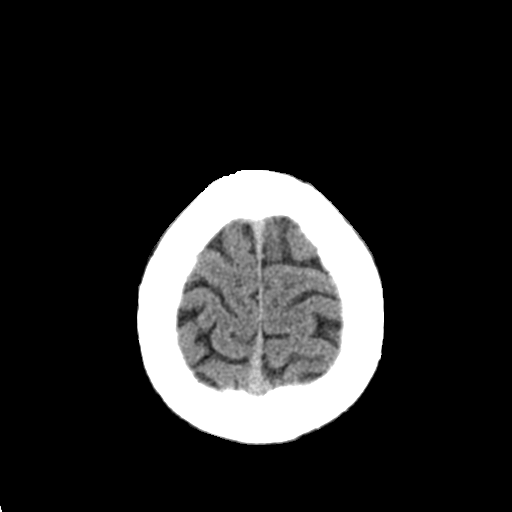

[Series 3: head bone · axial · 0.42mm/px · z∈[+271,+343]mm · 5 of 77 slices shown]
[im 8/77  bone]
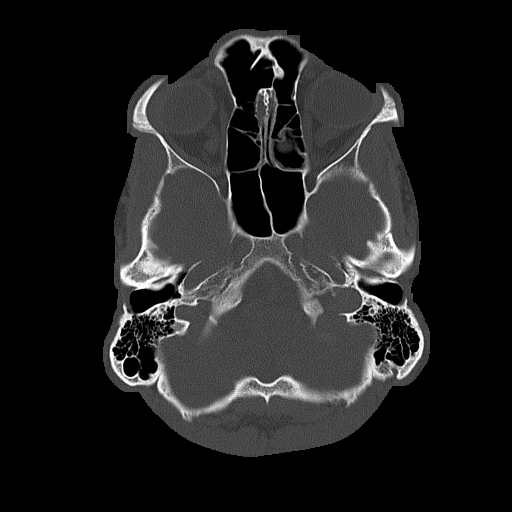
[im 15/77  bone]
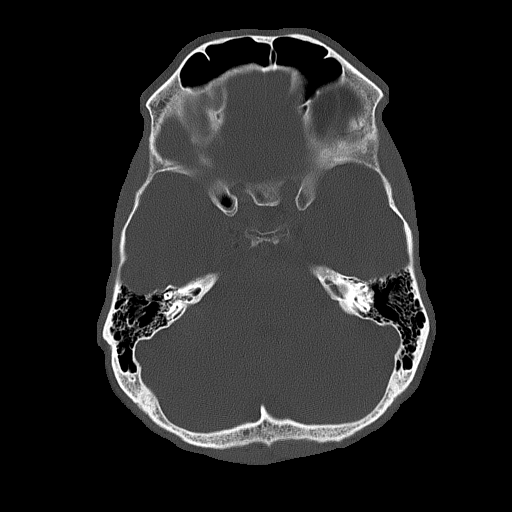
[im 26/77  bone]
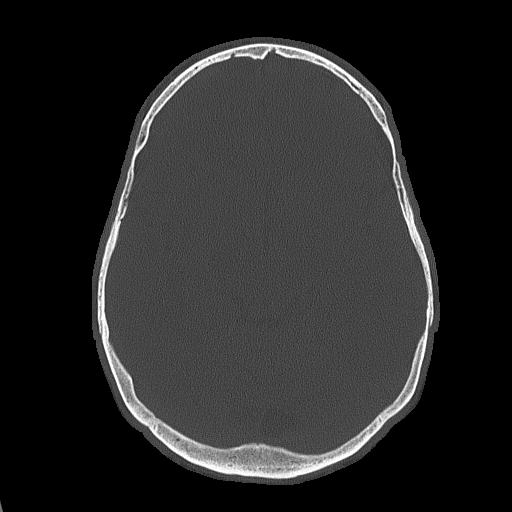
[im 33/77  bone]
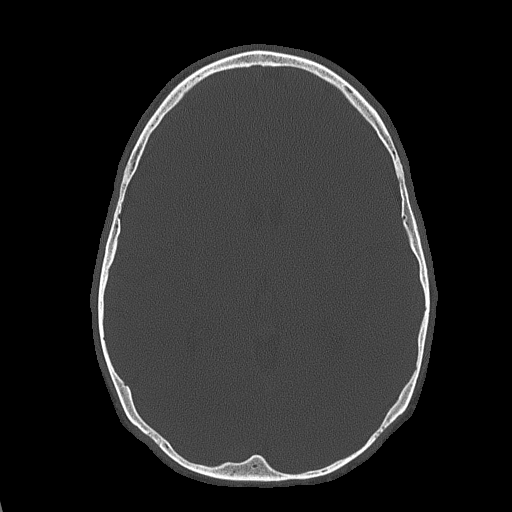
[im 44/77  bone]
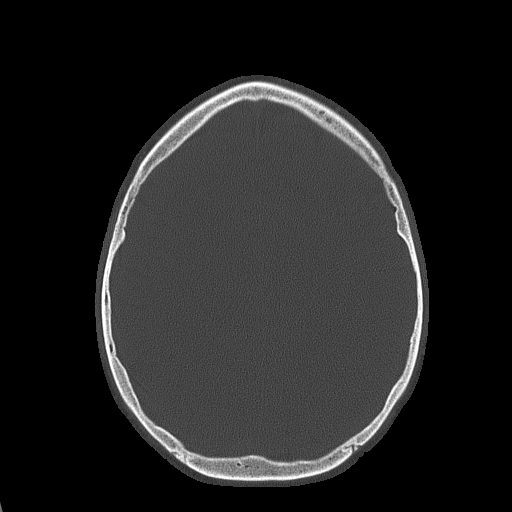

[Series 4: coronal soft tissue · coronal · 0.30mm/px · 3 of 62 slices shown]
[im 21/62  brain]
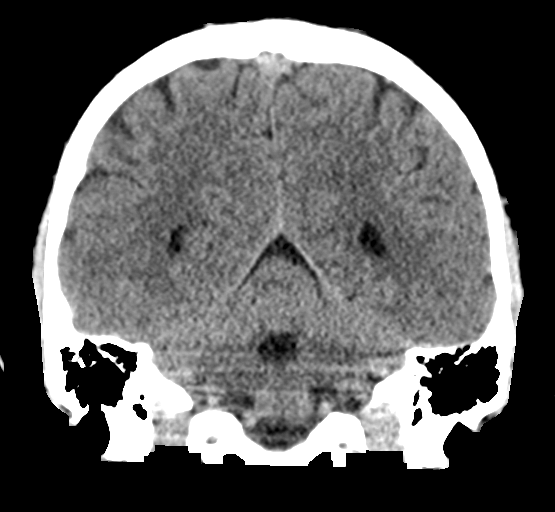
[im 28/62  brain]
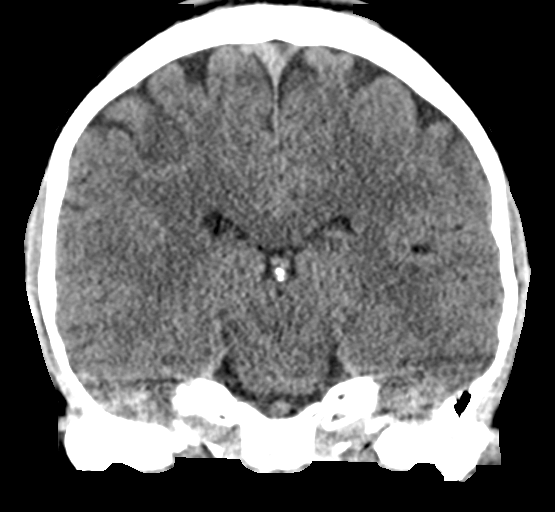
[im 34/62  brain]
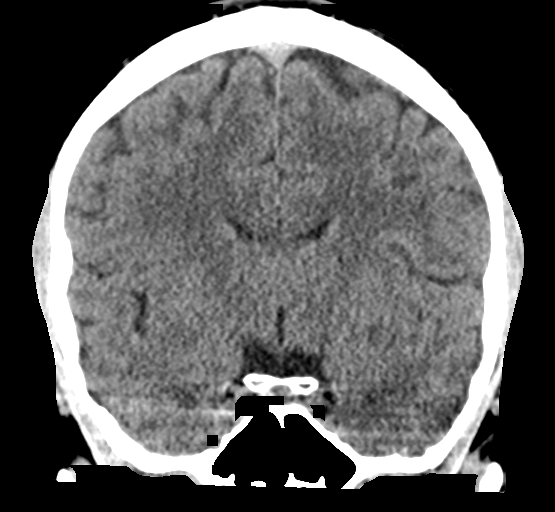

[Series 5: sagittal soft tissue · sagittal · 0.30mm/px · 3 of 51 slices shown]
[im 17/51  brain]
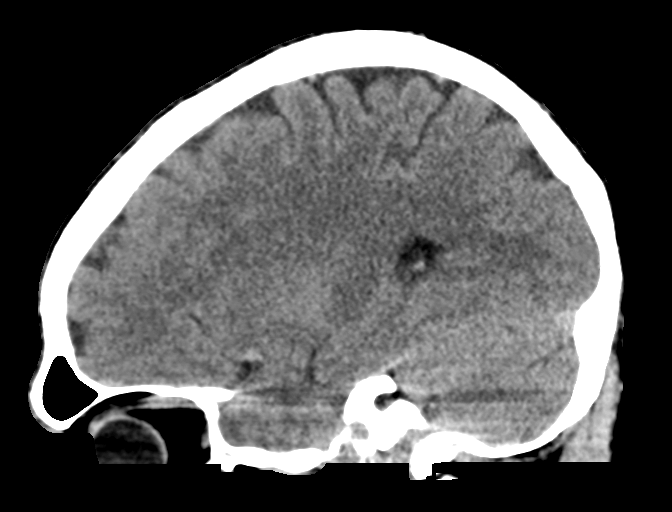
[im 26/51  brain]
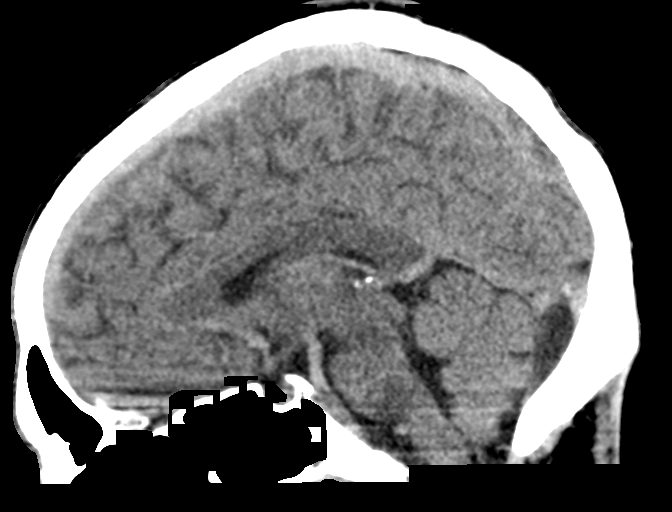
[im 34/51  brain]
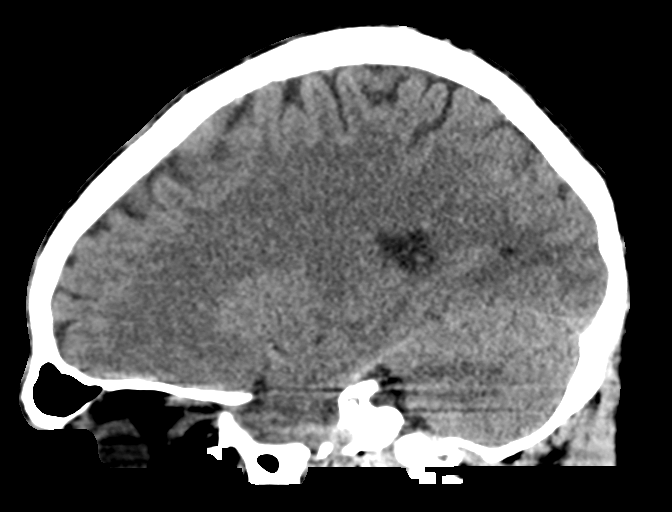

[17 of 47 positions shown; findings below may reference images not displayed]

FINDINGS: Brain: No acute infarct or hemorrhage. Lateral ventricles and
midline structures are unremarkable. No acute extra-axial fluid
collections. No mass effect.

Vascular: No hyperdense vessel or unexpected calcification.

Skull: Normal. Negative for fracture or focal lesion.

Sinuses/Orbits: Minimal mucosal thickening posterior left ethmoid
air cells. No gas fluid levels.

Other: None
IMPRESSION: 1. No acute intracranial process.
2. Minimal mucosal thickening left ethmoid sinuses.

## 2022-05-18 ENCOUNTER — Inpatient Hospital Stay
Admission: EM | Admit: 2022-05-18 | Discharge: 2022-05-20 | DRG: 208 | Payer: Medicaid Other | Attending: Hospitalist | Admitting: Hospitalist

## 2022-05-18 ENCOUNTER — Other Ambulatory Visit: Payer: Self-pay

## 2022-05-18 ENCOUNTER — Emergency Department: Payer: Medicaid Other

## 2022-05-18 DIAGNOSIS — Z1152 Encounter for screening for COVID-19: Secondary | ICD-10-CM

## 2022-05-18 DIAGNOSIS — F17210 Nicotine dependence, cigarettes, uncomplicated: Secondary | ICD-10-CM | POA: Diagnosis present

## 2022-05-18 DIAGNOSIS — Z79899 Other long term (current) drug therapy: Secondary | ICD-10-CM | POA: Diagnosis not present

## 2022-05-18 DIAGNOSIS — F1510 Other stimulant abuse, uncomplicated: Secondary | ICD-10-CM | POA: Diagnosis present

## 2022-05-18 DIAGNOSIS — J45901 Unspecified asthma with (acute) exacerbation: Secondary | ICD-10-CM | POA: Diagnosis not present

## 2022-05-18 DIAGNOSIS — K219 Gastro-esophageal reflux disease without esophagitis: Secondary | ICD-10-CM | POA: Diagnosis present

## 2022-05-18 DIAGNOSIS — J9600 Acute respiratory failure, unspecified whether with hypoxia or hypercapnia: Secondary | ICD-10-CM | POA: Diagnosis present

## 2022-05-18 DIAGNOSIS — R230 Cyanosis: Secondary | ICD-10-CM | POA: Diagnosis present

## 2022-05-18 DIAGNOSIS — F1910 Other psychoactive substance abuse, uncomplicated: Secondary | ICD-10-CM | POA: Diagnosis present

## 2022-05-18 DIAGNOSIS — R61 Generalized hyperhidrosis: Secondary | ICD-10-CM | POA: Diagnosis not present

## 2022-05-18 DIAGNOSIS — J9601 Acute respiratory failure with hypoxia: Principal | ICD-10-CM | POA: Diagnosis present

## 2022-05-18 DIAGNOSIS — J4552 Severe persistent asthma with status asthmaticus: Secondary | ICD-10-CM | POA: Diagnosis present

## 2022-05-18 DIAGNOSIS — R000 Tachycardia, unspecified: Secondary | ICD-10-CM | POA: Diagnosis present

## 2022-05-18 LAB — CBC
HCT: 48.9 % (ref 39.0–52.0)
Hemoglobin: 16.9 g/dL (ref 13.0–17.0)
MCH: 30.3 pg (ref 26.0–34.0)
MCHC: 34.6 g/dL (ref 30.0–36.0)
MCV: 87.6 fL (ref 80.0–100.0)
Platelets: 231 10*3/uL (ref 150–400)
RBC: 5.58 MIL/uL (ref 4.22–5.81)
RDW: 12.3 % (ref 11.5–15.5)
WBC: 10.9 10*3/uL — ABNORMAL HIGH (ref 4.0–10.5)
nRBC: 0 % (ref 0.0–0.2)

## 2022-05-18 LAB — BASIC METABOLIC PANEL
Anion gap: 10 (ref 5–15)
BUN: 10 mg/dL (ref 6–20)
CO2: 22 mmol/L (ref 22–32)
Calcium: 9.2 mg/dL (ref 8.9–10.3)
Chloride: 107 mmol/L (ref 98–111)
Creatinine, Ser: 0.73 mg/dL (ref 0.61–1.24)
GFR, Estimated: >60 mL/min (ref >60)
Glucose, Bld: 128 mg/dL — ABNORMAL HIGH (ref 70–99)
Potassium: 4.1 mmol/L (ref 3.5–5.1)
Sodium: 139 mmol/L (ref 135–145)

## 2022-05-18 LAB — HEPATIC FUNCTION PANEL
ALT: 24 U/L (ref 0–44)
AST: 27 U/L (ref 15–41)
Albumin: 4.5 g/dL (ref 3.5–5.0)
Alkaline Phosphatase: 62 U/L (ref 38–126)
Bilirubin, Direct: 0.2 mg/dL (ref 0.0–0.2)
Indirect Bilirubin: 0.4 mg/dL (ref 0.3–0.9)
Total Bilirubin: 0.6 mg/dL (ref 0.3–1.2)
Total Protein: 7.5 g/dL (ref 6.5–8.1)

## 2022-05-18 LAB — GLUCOSE, CAPILLARY: Glucose-Capillary: 142 mg/dL — ABNORMAL HIGH (ref 70–99)

## 2022-05-18 MED ORDER — POLYETHYLENE GLYCOL 3350 17 G PO PACK: 17.0000 g | PACK | Freq: Every day | ORAL | Status: DC | PRN

## 2022-05-18 MED ORDER — MIDAZOLAM HCL 2 MG/2ML IJ SOLN
INTRAMUSCULAR | Status: AC
  Administered 2022-05-18: 2 mg via INTRAVENOUS
  Filled 2022-05-18: qty 4

## 2022-05-18 MED ORDER — ORAL CARE MOUTH RINSE
15.0000 mL | OROMUCOSAL | Status: DC
  Administered 2022-05-19 (×6): 15 mL via OROMUCOSAL
  Filled 2022-05-18 (×4): qty 15

## 2022-05-18 MED ORDER — ALBUTEROL SULFATE (2.5 MG/3ML) 0.083% IN NEBU: 2.5000 mg | INHALATION_SOLUTION | RESPIRATORY_TRACT | Status: DC | PRN

## 2022-05-18 MED ORDER — PROPOFOL 1000 MG/100ML IV EMUL
5.0000 ug/kg/min | INTRAVENOUS | Status: DC
  Administered 2022-05-18: 5 ug/kg/min via INTRAVENOUS
  Administered 2022-05-19: 40 ug/kg/min via INTRAVENOUS
  Administered 2022-05-19: 60 ug/kg/min via INTRAVENOUS
  Filled 2022-05-18 (×3): qty 100

## 2022-05-18 MED ORDER — PANTOPRAZOLE 2 MG/ML SUSPENSION
40.0000 mg | Freq: Every day | ORAL | Status: DC
  Administered 2022-05-19: 40 mg
  Filled 2022-05-18: qty 20

## 2022-05-18 MED ORDER — VECURONIUM BROMIDE 10 MG IV SOLR
8.0000 mg | Freq: Once | INTRAVENOUS | Status: AC
  Administered 2022-05-18: 8 mg via INTRAVENOUS

## 2022-05-18 MED ORDER — CHLORHEXIDINE GLUCONATE CLOTH 2 % EX PADS: 6.0000 | MEDICATED_PAD | Freq: Every day | CUTANEOUS | Status: DC

## 2022-05-18 MED ORDER — MIDAZOLAM HCL 2 MG/2ML IJ SOLN
2.0000 mg | INTRAMUSCULAR | Status: DC | PRN
  Filled 2022-05-18: qty 2

## 2022-05-18 MED ORDER — MIDAZOLAM HCL 2 MG/2ML IJ SOLN
2.0000 mg | INTRAMUSCULAR | Status: AC | PRN
  Administered 2022-05-18 – 2022-05-19 (×2): 2 mg via INTRAVENOUS
  Filled 2022-05-18 (×2): qty 2

## 2022-05-18 MED ORDER — KETAMINE HCL 50 MG/5ML IJ SOSY
2.0000 mg/kg | PREFILLED_SYRINGE | Freq: Once | INTRAMUSCULAR | Status: AC
  Administered 2022-05-18: 148 mg via INTRAVENOUS

## 2022-05-18 MED ORDER — POLYETHYLENE GLYCOL 3350 17 G PO PACK
17.0000 g | PACK | Freq: Every day | ORAL | Status: DC
  Administered 2022-05-18 – 2022-05-19 (×2): 17 g
  Filled 2022-05-18 (×2): qty 1

## 2022-05-18 MED ORDER — ETOMIDATE 2 MG/ML IV SOLN: 20.0000 mg | Freq: Once | INTRAVENOUS | Status: DC

## 2022-05-18 MED ORDER — DOCUSATE SODIUM 100 MG PO CAPS: 100.0000 mg | ORAL_CAPSULE | Freq: Two times a day (BID) | ORAL | Status: DC | PRN

## 2022-05-18 MED ORDER — DOCUSATE SODIUM 50 MG/5ML PO LIQD
100.0000 mg | Freq: Two times a day (BID) | ORAL | Status: DC
  Administered 2022-05-19 (×2): 100 mg
  Filled 2022-05-18 (×2): qty 10

## 2022-05-18 MED ORDER — FENTANYL 2500MCG IN NS 250ML (10MCG/ML) PREMIX INFUSION
0.0000 ug/h | INTRAVENOUS | Status: DC
  Administered 2022-05-18: 50 ug/h via INTRAVENOUS
  Filled 2022-05-18: qty 250

## 2022-05-18 MED ORDER — SUCCINYLCHOLINE CHLORIDE 200 MG/10ML IV SOSY
PREFILLED_SYRINGE | INTRAVENOUS | Status: AC
  Administered 2022-05-18: 140 mg via INTRAVENOUS
  Filled 2022-05-18: qty 10

## 2022-05-18 MED ORDER — PROPOFOL 1000 MG/100ML IV EMUL
INTRAVENOUS | Status: AC
  Filled 2022-05-18: qty 100

## 2022-05-18 MED ORDER — MIDAZOLAM HCL 2 MG/2ML IJ SOLN
4.0000 mg | Freq: Once | INTRAMUSCULAR | Status: AC
  Administered 2022-05-18: 4 mg via INTRAVENOUS

## 2022-05-18 MED ORDER — ALBUTEROL SULFATE (2.5 MG/3ML) 0.083% IN NEBU
10.0000 mg | INHALATION_SOLUTION | RESPIRATORY_TRACT | Status: AC
  Administered 2022-05-18: 10 mg via RESPIRATORY_TRACT
  Filled 2022-05-18: qty 12

## 2022-05-18 MED ORDER — FENTANYL CITRATE PF 50 MCG/ML IJ SOSY
50.0000 ug | PREFILLED_SYRINGE | Freq: Once | INTRAMUSCULAR | Status: AC
  Administered 2022-05-18: 50 ug via INTRAVENOUS
  Filled 2022-05-18: qty 1

## 2022-05-18 MED ORDER — MAGNESIUM SULFATE 2 GM/50ML IV SOLN
2.0000 g | Freq: Once | INTRAVENOUS | Status: AC
  Administered 2022-05-18: 2 g via INTRAVENOUS

## 2022-05-18 MED ORDER — VECURONIUM BROMIDE 10 MG IV SOLR
INTRAVENOUS | Status: AC
  Filled 2022-05-18: qty 10

## 2022-05-18 MED ORDER — ETOMIDATE 2 MG/ML IV SOLN
INTRAVENOUS | Status: AC
  Filled 2022-05-18: qty 10

## 2022-05-18 MED ORDER — ALBUTEROL SULFATE (2.5 MG/3ML) 0.083% IN NEBU
2.5000 mg | INHALATION_SOLUTION | RESPIRATORY_TRACT | Status: DC
  Administered 2022-05-19 (×3): 2.5 mg via RESPIRATORY_TRACT
  Filled 2022-05-18: qty 3

## 2022-05-18 MED ORDER — FENTANYL BOLUS VIA INFUSION: 50.0000 ug | INTRAVENOUS | Status: DC | PRN

## 2022-05-18 MED ORDER — ENOXAPARIN SODIUM 40 MG/0.4ML IJ SOSY
40.0000 mg | PREFILLED_SYRINGE | INTRAMUSCULAR | Status: DC
  Administered 2022-05-18 – 2022-05-19 (×2): 40 mg via SUBCUTANEOUS
  Filled 2022-05-18 (×2): qty 0.4

## 2022-05-18 MED ORDER — SUCCINYLCHOLINE CHLORIDE 200 MG/10ML IV SOSY: 140.0000 mg | PREFILLED_SYRINGE | Freq: Once | INTRAVENOUS | Status: AC

## 2022-05-18 MED ORDER — SODIUM CHLORIDE 0.9 % IV BOLUS
1000.0000 mL | Freq: Once | INTRAVENOUS | Status: AC
  Administered 2022-05-18: 1000 mL via INTRAVENOUS

## 2022-05-18 MED ORDER — KETAMINE HCL 10 MG/ML IJ SOLN
INTRAMUSCULAR | Status: AC
  Filled 2022-05-18: qty 1

## 2022-05-18 MED ORDER — ORAL CARE MOUTH RINSE: 15.0000 mL | OROMUCOSAL | Status: DC | PRN

## 2022-05-18 NOTE — ED Provider Notes (Signed)
[facility] Provider Note    Event Date/Time   First MD Initiated Contact with Patient 05/18/22 1927     (approximate)   History   Shortness of breath  EM caveat: Tripoding with obvious respiratory distress  HPI  [name] is a 36 y.o. male who was called out for by EMS due to severe difficulty breathing.  EMS reports the patient was cyanotic balloons with severe distress and inability to breathe almost on their arrival.  He was placed on CPAP given 125 mg of Solu-Medrol and 2 albuterol treatments with some improvement now able to perceive breath sounds and he has been tolerating CPAP well with EMS.  EMS reports he was extremely dyspneic and cyanotic and blue on their arrival to pick up the patient he has not been on his asthma medications for about 3 months       Physical Exam   Triage Vital Signs: ED Triage Vitals  Enc Vitals Group     BP      Pulse      Resp      Temp      Temp src      SpO2      Weight      Height      Head Circumference      Peak Flow      Pain Score      Pain Loc      Pain Edu?      Excl. in [place]?     Most recent vital signs: Vitals:   05/18/22 2100 05/18/22 2105  BP: 107/66 107/63  Pulse: 100 95  Resp: (!) 23 (!) 22  Temp:    SpO2: 98% 98%     General: Alert, tripoding, currently on CPAP.  Diaphoretic.  Obvious respiratory distress CV:  Good peripheral perfusion.  Tachycardia well-perfused Resp:  Fuhs expiratory wheezing, severe expiratory and accessory muscle use, tripoding on CPAP. Abd:  No distention.  Other:  Lower EXTR lower extremity edema venous cords or congestion.  Moves all extremities well.  Able to communicate over the CPAP device with 1-2 word sentences   ED Results / Procedures / Treatments   Labs (all labs ordered are listed, but only abnormal results are displayed) Labs Reviewed  CBC - Abnormal; Notable for the following components:      Result Value   WBC 10.9 (*)    All other  components within normal limits  BASIC METABOLIC PANEL - Abnormal; Notable for the following components:   Glucose, Bld 128 (*)    All other components within normal limits  RESP PANEL BY RT-PCR (FLU A&B, COVID) [id number]  BLOOD GAS, ARTERIAL     EKG  And interpreted by me at 1930 heart rate 130 QRS 100 QTc 450 Sinus tachycardia, notable baseline artifact due to patient respiratory pattern. No obvious frank ischemic changes are noted   RADIOLOGY  Chest x-ray interpreted by me as  without evidence of acute infiltrate  DG Chest Portable 1 View  Result Date: 05/18/2022 CLINICAL DATA:  OG tube advancement EXAM: PORTABLE CHEST 1 VIEW COMPARISON:  05/18/2022 FINDINGS: Enteric tube was advanced with tip now projecting over the left upper quadrant consistent with location in the body of the stomach. Proximal side hole now projects well below the expected level of the EG junction. Endotracheal tube tip measures 4.6 cm above the carina. Heart size and pulmonary vascularity are normal. Lungs are clear. IMPRESSION: Appliances appear in satisfactory position.  Lungs are clear. Electronically Signed   By: Burman Nieves M.D.   On: 05/18/2022 20:57   DG Chest Portable 1 View  Result Date: 05/18/2022 CLINICAL DATA:  Intubation and OG tube placement. Respiratory distress. Asthma and current smoker. EXAM: PORTABLE CHEST 1 VIEW COMPARISON:  05/18/2022 FINDINGS: Endotracheal tube placed with tip measuring 6.2 cm above the carina. Enteric tube placed with tip over the left upper quadrant consistent with location in the upper stomach. Proximal side hole projects at or just above the level of the EG junction. Heart size and pulmonary vascularity are normal. Lungs are clear. No pleural effusions. No pneumothorax. Mediastinal contours appear intact. IMPRESSION: 1. Endotracheal tube appears in satisfactory position. Enteric tube tip projects over the expected location of the upper stomach but proximal side hole  projects over the distal EG junction. Advancement of a couple cm is suggested. 2.  Lungs are clear. Electronically Signed   By: Burman Nieves M.D.   On: 05/18/2022 20:57   DG Chest Portable 1 View  Result Date: 05/18/2022 CLINICAL DATA:  Respiratory distress EXAM: PORTABLE CHEST 1 VIEW COMPARISON:  Chest x-ray 06/15/2020 FINDINGS: The heart size and mediastinal contours are within normal limits. Both lungs are clear. The visualized skeletal structures are unremarkable. IMPRESSION: No active disease. Electronically Signed   By: Darliss Cheney M.D.   On: 05/18/2022 19:59     Intubation chest x-ray interpreted by me as appropriate positioning after advancing OG tube   PROCEDURES:  Critical Care performed: Yes, see critical care procedure note(s)  CRITICAL CARE Performed by: Sharyn Creamer   Total critical care time: 50 minutes  Critical care time was exclusive of separately billable procedures and treating other patients.  Critical care was necessary to treat or prevent imminent or life-threatening deterioration.  Critical care was time spent personally by me on the following activities: development of treatment plan with patient and/or surrogate as well as nursing, discussions with consultants, evaluation of patient's response to treatment, examination of patient, obtaining history from patient or surrogate, ordering and performing treatments and interventions, ordering and review of laboratory studies, ordering and review of radiographic studies, pulse oximetry and re-evaluation of patient's condition.   Procedure Name: Intubation Date/Time: 05/18/2022 8:33 PM  Performed by: Sharyn Creamer, MDPre-anesthesia Checklist: Patient identified, Patient being monitored, Emergency Drugs available, Timeout performed and Suction available Oxygen Delivery Method: Ambu bag Preoxygenation: Pre-oxygenation with 100% oxygen Induction Type: Rapid sequence Ventilation: Mask ventilation without  difficulty Laryngoscope Size: Glidescope and 3 Grade View: Grade II Tube type: Subglottic suction tube Tube size: 7.5 mm Number of attempts: 1 Airway Equipment and Method: Patient positioned with wedge pillow Placement Confirmation: ETT inserted through vocal cords under direct vision, CO2 detector and Breath sounds checked- equal and bilateral Secured at: 25 cm Tube secured with: ETT holder     Prior to intubation confirm patient name demographics with patient directly.  Patient agreeable with plan for intubation and sedation and admission  MEDICATIONS ORDERED IN ED: Medications  propofol (DIPRIVAN) 1000 MG/100ML infusion (40 mcg/kg/min  74 kg Intravenous Rate/Dose Change 05/18/22 2041)  ketamine (KETALAR) 10 MG/ML injection (has no administration in time range)  etomidate (AMIDATE) 2 MG/ML injection (has no administration in time range)  vecuronium (NORCURON) 10 MG injection (has no administration in time range)  albuterol (PROVENTIL) (2.5 MG/3ML) 0.083% nebulizer solution 10 mg (has no administration in time range)  midazolam (VERSED) 2 MG/2ML injection (has no administration in time range)  pantoprazole (PROTONIX) 2 mg/mL oral suspension  40 mg (has no administration in time range)  docusate (COLACE) 50 MG/5ML liquid 100 mg (has no administration in time range)  polyethylene glycol (MIRALAX / GLYCOLAX) packet 17 g (17 g Per Tube Given 05/18/22 2050)  Oral care mouth rinse (has no administration in time range)  Oral care mouth rinse (has no administration in time range)  fentaNYL in NS (85mcg/ml) infusion-PREMIX (50 mcg/hr Intravenous New Bag/Given 05/18/22 2049)  fentaNYL (SUBLIMAZE) bolus via infusion 50-100 mcg (has no administration in time range)  midazolam (VERSED) injection 2 mg (has no administration in time range)  midazolam (VERSED) injection 2 mg (has no administration in time range)  albuterol (PROVENTIL) (2.5 MG/3ML) 0.083% nebulizer solution 10 mg (10  mg Nebulization Given 05/18/22 1930)  magnesium sulfate IVPB 2 g 50 mL (0 g Intravenous Stopped 05/18/22 2016)  ketamine 50 mg in normal saline 5 mL (10 mg/mL) syringe (148 mg Intravenous Given 05/18/22 2022)  succinylcholine (ANECTINE) syringe 140 mg (140 mg Intravenous Given 05/18/22 2023)  vecuronium (NORCURON) injection 8 mg (8 mg Intravenous Given 05/18/22 2041)  midazolam (VERSED) injection 4 mg (4 mg Intravenous Given 05/18/22 2040)  fentaNYL (SUBLIMAZE) injection 50 mcg (50 mcg Intravenous Given 05/18/22 2047)  sodium chloride 0.9 % bolus 1,000 mL (1,000 mLs Intravenous New Bag/Given 05/18/22 2052)     IMPRESSION / MDM / ASSESSMENT AND PLAN / ED COURSE  I reviewed the triage vital signs and the nursing notes.                              Differential diagnosis includes, but is not limited to, status asthmaticus, pneumothorax, ACS, pneumonia, viral etiology etc.  Patient has a known history of asthma reports previous intubations due to asthma in the past.  He has obvious wheezing and severe increased work of breathing, improved after short trial of BiPAP but then again worsening and tiring and fatiguing leading to intubation.  Continuous albuterol treatment, magnesium IV, steroids given prehospital.  Patient's presentation is most consistent with acute presentation with potential threat to life or bodily function.  The patient is on the cardiac monitor to evaluate for evidence of arrhythmia and/or significant heart rate changes.  Patient with notable ventilator dyssynchrony due to sedation needs despite use of propofol as well as propofol bolus.  Patient received vecuronium as well as midazolam as we titrate sedation.  Clinical Course as of 05/18/22 2118  Wed May 18, 2022  1940 Patient's work of breathing markedly improved at this time.  He is resting more comfortably.  Tolerating BiPAP well.  Albuterol being initiated. [MQ]  1940 He is alert, does not show evidence of distress  or weakening, seems to be doing quite well with the BiPAP treatment 98% oxygen sat.  Patient is aware and agreeable with the plan for needed to be admitted [MQ]  2009 Patient condition worsening despite continuous albuterol.  I discussed with him and he is agreeable with proceeding with intubation.  He is now diaphoretic more tachypneic and appears to have some evidence of tiring of respiratory status now.  He is still alert well oriented and not in extreme extremis, but appears to be worsening despite treatments and at this point I think he is beginning to show evidence of fatigue and will require intubation. [MQ]  2048 I personally advanced the patient's orogastric tube approximately 10 cm. [MQ]  2049 Patient resting comfortably, normal oxygen saturation on current vent settings which  are being titrated based on ICU team recommendations as well with respiratory rate of approximately 12 and extended inspiratory time, tidal volume 450. [MQ]    Clinical Course User Index [MQ] Sharyn Creamer, MD   Discussed case reviewed ventilator settings and recommendations with Dr. Karna Christmas of [facility], ventilator settings adjusted as per his recommendations.  Currently at 8:40 PM NP Rust-Chester at bedside providing critical care consult and admission  915pm, patient resting comfortably tolerating vent and sedation at this time.  Admitted to ICU team  FINAL CLINICAL IMPRESSION(S) / ED DIAGNOSES   Final diagnoses:  Severe persistent asthma with status asthmaticus     Rx / DC Orders   ED Discharge Orders     None        Note:  This document was prepared using Dragon voice recognition software and may include unintentional dictation errors.   Sharyn Creamer, MD 05/18/22 2119

## 2022-05-18 NOTE — ED Notes (Signed)
Per [employer], np increase fentanyl to 283mcg/hr.

## 2022-05-18 NOTE — ED Notes (Signed)
Pt with worsening resp status, md notified and over to assess.

## 2022-05-18 NOTE — H&P (Signed)
NAME:  [name], MRN:  [id number], DOB:  1985-09-08, LOS: 0 ADMISSION DATE:  05/18/2022, CONSULTATION DATE:  05/18/22 REFERRING MD:  Dr. Jacqualine Code, CHIEF COMPLAINT: Respiratory Distress   History of Present Illness:  36 yo M  [facility] jail on [facility]/18/2022 with complaints of respiratory distress. History obtained from officer bedside and ED report. Patient has been jailed these last 3 months, and while he has required albuterol treatments during previous incarcerations he has not needed any medication during this incarceration. Per officer bedside, he had no recent complaints of any kind and as far as they know he was not around any sick contacts. Earlier on 10/11 the patient became short of breath requesting an albuterol treatment, but the jail did not have any medication available at that time. EMS arrived and reportedly found the patient cyanotic in severe distress. He received a 3 albuterol & 2 duoneb nebulizer treatments, solu medrol 125 mg and was placed on CPAP support en route.   ED course: Upon arrival patient in distress and cyanotic. Afebrile, tachypneic & tachycardic but BP stable. Placed on BIPAP support with additional continuous nebulizer treatment provided. Patient initially looking more comfortable on BIPAP, but quickly decompensated. He became diaphoretic with increased WOB and the decision was made to emergently intubate him and place him on mechanical ventilatory support. Lab work revealed very mild leukocytosis, otherwise unremarkable. Medications given: 10 mg albuterol, 2 g Mg, ketamine, fentanyl, versed, vecuronium, 1 L NS bolus & succinylcholine. Propofol drip started. Initial Vitals: 98.1, 26, 123, 149/103 & SpO2 98% on BIPAP Significant labs: (Labs/ Imaging personally reviewed) I, Domingo Pulse Rust-Chester, AGACNP-BC, personally viewed and interpreted this ECG. EKG Interpretation: Date: 05/18/22, EKG Time: 19:28, Rate: 131,  Narrative  Interpretation: challenging interpretation due to artifact- will repeat Chemistry: Na+: 139, K+: 4.1, BUN/Cr.: 10/0.73, Serum CO2/ AG: 22/10 Hematology: WBC: 10.9, Hgb: 16.9,  PCT: pending, COVID-19 & Influenza A/B: pending, Viral respiratory panel: pending  ABG: 7.35/44/283/24.3 CXR 05/18/22: no acute cardiopulmonary disease  PCCM consulted for admission due to acute respiratory failure secondary to suspected asthma exacerbation requiring mechanical ventilatory support.  Pertinent  Medical History  Asthma Polysubstance abuse (patient previously positive for amphetamines) Tobacco Use (smoking 0.5 pack a day in 2021) Significant Hospital Events: Including procedures, antibiotic start and stop dates in addition to other pertinent events   05/18/22: Admit to ICU due to acute respiratory failure secondary to suspected asthma exacerbation requiring mechanical ventilatory support.  Interim History / Subjective:  Patient sedated on ventilator support, with officer bedside.  Objective   Blood pressure 107/63, pulse 95, temperature 98.1 F (36.7 C), temperature source Axillary, resp. rate (!) 22, height 6' (1.829 m), weight 74 kg, SpO2 98 %.    Vent Mode: AC FiO2 (%):  [60 %] 60 % Set Rate:  [12 bmp] 12 bmp Vt Set:  [450 mL] 450 mL PEEP:  [5 cmH20] 5 cmH20   Intake/Output Summary (Last 24 hours) at 05/18/2022 2136 Last data filed at 05/18/2022 2016 Gross per 24 hour  Intake 50 ml  Output --  Net 50 ml   Filed Weights   05/18/22 1932  Weight: 74 kg    Examination: General: Adult male, critically ill, lying in bed intubated & sedated requiring mechanical ventilation, NAD HEENT: MM pink/moist, anicteric, atraumatic, neck supple Neuro: sedated, unable to follow commands, PERRL +3, MAE CV: s1s2 RRR, NSR on monitor, no r/m/g Pulm: Regular, non labored on AC 60% & PEEP 5 ,  breath sounds coarse-BUL & diminished-BLL GI: soft, rounded, non tender, bs x 4 GU: foley in place with clear  yellow urine Skin: limited exam-no rashes/lesions noted Extremities: warm/dry, pulses + 2 R/P, no edema noted  Resolved Hospital Problem list     Assessment & Plan:  Acute Hypoxic Respiratory Failure secondary to suspected Asthma exacerbation  PMHx: Asthma - Ventilator settings: PRVC  6 mL/kg, 60% FiO2, 5 PEEP, continue ventilator support & lung protective strategies - Wean PEEP & FiO2 as tolerated, maintain SpO2 > 90% - Head of bed elevated 30 degrees, VAP protocol in place - Plateau pressures less than 30 cm H20  - Intermittent chest x-ray & ABG PRN - Daily WUA with SBT as tolerated  - Ensure adequate pulmonary hygiene  - F/u cultures, trend PCT > tracheal aspirate sent, viral panels pending - Consider antibiotic coverage depending on PCT results - Steroids initiated: solu-medrol 40 mg daily - Albuterol nebs Q 4 h, bronchodilators PRN - PAD protocol in place: continue Fentanyl drip & Propofol drip  Polysubstance Abuse history Unclear if patient is currently using any substances at this time. - UDS pending - supportive care  Best Practice (right click and "Reselect all SmartList Selections" daily)  Diet/type: NPO w/ meds via tube DVT prophylaxis: LMWH GI prophylaxis: PPI Lines: N/A Foley:  Yes, and it is still needed Code Status:  full code Last date of multidisciplinary goals of care discussion [05/18/22]  Labs   CBC: Recent Labs  Lab 05/18/22 1930  WBC 10.9*  HGB 16.9  HCT 48.9  MCV 87.6  PLT 231    Basic Metabolic Panel: Recent Labs  Lab 05/18/22 1930  NA 139  K 4.1  CL 107  CO2 22  GLUCOSE 128*  BUN 10  CREATININE 0.73  CALCIUM 9.2   GFR: Estimated Creatinine Clearance: 132.3 mL/min (by C-G formula based on SCr of 0.73 mg/dL). Recent Labs  Lab 05/18/22 1930  WBC 10.9*    Liver Function Tests: No results for input(s): "AST", "ALT", "ALKPHOS", "BILITOT", "PROT", "ALBUMIN" in the last 168 hours. No results for input(s): "LIPASE", "AMYLASE" in  the last 168 hours. No results for input(s): "AMMONIA" in the last 168 hours.  ABG No results found for: "PHART", "PCO2ART", "PO2ART", "HCO3", "TCO2", "ACIDBASEDEF", "O2SAT"   Coagulation Profile: No results for input(s): "INR", "PROTIME" in the last 168 hours.  Cardiac Enzymes: No results for input(s): "CKTOTAL", "CKMB", "CKMBINDEX", "TROPONINI" in the last 168 hours.  HbA1C: No results found for: "HGBA1C"  CBG: No results for input(s): "GLUCAP" in the last 168 hours.  Review of Systems:   UTA- patient intubated and sedated.  Past Medical History:  He,  has a past medical history of Asthma, GERD (gastroesophageal reflux disease), and Substance abuse (HCC).   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 He has been smoking an average of .5 packs per day. He has never used smokeless tobacco. He reports that he does not currently use drugs after having used the following drugs: Heroin. He reports that he does not drink alcohol.   Family History:  His family history is not on file.   Allergies No Known Allergies   Home Medications  Prior to Admission medications   Medication Sig Start  Date End Date Taking? Authorizing Provider  acetaminophen (TYLENOL) 500 MG tablet Take 2 tablets (1,000 mg total) by mouth every 6 (six) hours as needed for mild pain, moderate pain, fever or headache. 09/25/20   Duanne Guess, MD  albuterol (VENTOLIN HFA) 108 (90 Base) MCG/ACT inhaler Inhale 2 puffs into the lungs every 6 (six) hours as needed for wheezing or shortness of breath.    [provider]     Critical care time: 60 minutes       Betsey Holiday, AGACNP-BC Acute Care Nurse Practitioner [facility]   [phone] / [phone] Please see Amion for pager details.

## 2022-05-18 NOTE — ED Triage Notes (Signed)
Pt from jail with severe resp distress. Pt with cpap in place, ems gave 125mg  solumedrol, 3 albuterol, two duoneb. Pt alert, diminished breath sounds with mixed wheezing.

## 2022-05-18 NOTE — ED Notes (Signed)
Head of bed at 45 degrees.  

## 2022-05-19 ENCOUNTER — Inpatient Hospital Stay: Payer: Medicaid Other

## 2022-05-19 ENCOUNTER — Other Ambulatory Visit: Payer: Self-pay

## 2022-05-19 ENCOUNTER — Encounter: Payer: Self-pay | Admitting: Pulmonary Disease

## 2022-05-19 LAB — RESP PANEL BY RT-PCR (FLU A&B, COVID) ARPGX2
Influenza A by PCR: NEGATIVE
Influenza B by PCR: NEGATIVE
SARS Coronavirus 2 by RT PCR: NEGATIVE

## 2022-05-19 LAB — URINE DRUG SCREEN, QUALITATIVE (ARMC ONLY)
Amphetamines, Ur Screen: NOT DETECTED
Barbiturates, Ur Screen: NOT DETECTED
Benzodiazepine, Ur Scrn: POSITIVE — AB
Cannabinoid 50 Ng, Ur ~~LOC~~: NOT DETECTED
Cocaine Metabolite,Ur ~~LOC~~: NOT DETECTED
MDMA (Ecstasy)Ur Screen: NOT DETECTED
Methadone Scn, Ur: NOT DETECTED
Opiate, Ur Screen: NOT DETECTED
Phencyclidine (PCP) Ur S: NOT DETECTED
Tricyclic, Ur Screen: NOT DETECTED

## 2022-05-19 LAB — CBC
HCT: 41.8 % (ref 39.0–52.0)
Hemoglobin: 14 g/dL (ref 13.0–17.0)
MCH: 30.2 pg (ref 26.0–34.0)
MCHC: 33.5 g/dL (ref 30.0–36.0)
MCV: 90.1 fL (ref 80.0–100.0)
Platelets: 199 10*3/uL (ref 150–400)
RBC: 4.64 MIL/uL (ref 4.22–5.81)
RDW: 13.1 % (ref 11.5–15.5)
WBC: 11.9 10*3/uL — ABNORMAL HIGH (ref 4.0–10.5)
nRBC: 0 % (ref 0.0–0.2)

## 2022-05-19 LAB — BASIC METABOLIC PANEL
Anion gap: 12 (ref 5–15)
BUN: 20 mg/dL (ref 6–20)
CO2: 19 mmol/L — ABNORMAL LOW (ref 22–32)
Calcium: 8.7 mg/dL — ABNORMAL LOW (ref 8.9–10.3)
Chloride: 109 mmol/L (ref 98–111)
Creatinine, Ser: 1.85 mg/dL — ABNORMAL HIGH (ref 0.61–1.24)
GFR, Estimated: 48 mL/min — ABNORMAL LOW (ref >60)
Glucose, Bld: 140 mg/dL — ABNORMAL HIGH (ref 70–99)
Potassium: 4.3 mmol/L (ref 3.5–5.1)
Sodium: 140 mmol/L (ref 135–145)

## 2022-05-19 LAB — URINALYSIS, COMPLETE (UACMP) WITH MICROSCOPIC
Bacteria, UA: NONE SEEN
Bilirubin Urine: NEGATIVE
Glucose, UA: NEGATIVE mg/dL
Hgb urine dipstick: NEGATIVE
Ketones, ur: NEGATIVE mg/dL
Leukocytes,Ua: NEGATIVE
Nitrite: NEGATIVE
Protein, ur: NEGATIVE mg/dL
Specific Gravity, Urine: 1.019 (ref 1.005–1.030)
pH: 5 (ref 5.0–8.0)

## 2022-05-19 LAB — RESPIRATORY PANEL BY PCR

## 2022-05-19 LAB — MRSA NEXT GEN BY PCR, NASAL: MRSA by PCR Next Gen: DETECTED — AB

## 2022-05-19 LAB — PROCALCITONIN: Procalcitonin: <0.1 ng/mL

## 2022-05-19 LAB — MAGNESIUM: Magnesium: 2.1 mg/dL (ref 1.7–2.4)

## 2022-05-19 LAB — PHOSPHORUS: Phosphorus: 6 mg/dL — ABNORMAL HIGH (ref 2.5–4.6)

## 2022-05-19 MED ORDER — BUDESONIDE 0.25 MG/2ML IN SUSP
0.2500 mg | Freq: Two times a day (BID) | RESPIRATORY_TRACT | Status: DC
  Administered 2022-05-19 – 2022-05-20 (×3): 0.25 mg via RESPIRATORY_TRACT
  Filled 2022-05-19 (×3): qty 2

## 2022-05-19 MED ORDER — LACTATED RINGERS IV BOLUS
1000.0000 mL | Freq: Once | INTRAVENOUS | Status: AC
  Administered 2022-05-19: 1000 mL via INTRAVENOUS

## 2022-05-19 MED ORDER — LORAZEPAM 2 MG/ML IJ SOLN
1.0000 mg | Freq: Once | INTRAMUSCULAR | Status: AC
  Administered 2022-05-19: 1 mg via INTRAVENOUS
  Filled 2022-05-19: qty 1

## 2022-05-19 MED ORDER — ORAL CARE MOUTH RINSE: 15.0000 mL | OROMUCOSAL | Status: DC | PRN

## 2022-05-19 MED ORDER — IOHEXOL 350 MG/ML SOLN
75.0000 mL | Freq: Once | INTRAVENOUS | Status: AC | PRN
  Administered 2022-05-19: 75 mL via INTRAVENOUS

## 2022-05-19 MED ORDER — METOPROLOL TARTRATE 5 MG/5ML IV SOLN
2.5000 mg | Freq: Once | INTRAVENOUS | Status: AC
  Administered 2022-05-19: 2.5 mg via INTRAVENOUS
  Filled 2022-05-19: qty 5

## 2022-05-19 MED ORDER — LEVALBUTEROL HCL 0.63 MG/3ML IN NEBU: 0.6300 mg | INHALATION_SOLUTION | Freq: Three times a day (TID) | RESPIRATORY_TRACT | Status: DC | PRN

## 2022-05-19 MED ORDER — METHYLPREDNISOLONE SODIUM SUCC 40 MG IJ SOLR
40.0000 mg | INTRAMUSCULAR | Status: DC
  Administered 2022-05-19 – 2022-05-20 (×2): 40 mg via INTRAVENOUS
  Filled 2022-05-19 (×2): qty 1

## 2022-05-19 MED ORDER — MUPIROCIN 2 % EX OINT
TOPICAL_OINTMENT | Freq: Two times a day (BID) | CUTANEOUS | Status: DC
  Filled 2022-05-19 (×2): qty 22

## 2022-05-19 NOTE — Progress Notes (Signed)
Patient extubated to 2L [place] per MD request. No complications with extubation and patient saturrations are 94% on the 2L .

## 2022-05-19 NOTE — Progress Notes (Signed)
Patient has a hand cuff to his right ankle under police custody.

## 2022-05-19 NOTE — Progress Notes (Signed)
Per Dr A. Patient can go down to CT WITHOUT nursing as patient is stepdown with no drips. Officer will be escorting patient as he is in his custody. Continue to assess.

## 2022-05-19 NOTE — Progress Notes (Signed)
[name] for Electrolyte Monitoring and Replacement   Recent Labs: Potassium (mmol/L)  Date Value  05/19/2022 4.3  04/06/2014 4.1   Magnesium (mg/dL)  Date Value  05/19/2022 2.1   Calcium (mg/dL)  Date Value  05/19/2022 8.7 (L)   Calcium, Total (mg/dL)  Date Value  04/06/2014 8.7   Albumin (g/dL)  Date Value  05/18/2022 4.5  04/06/2014 4.2   Phosphorus (mg/dL)  Date Value  05/19/2022 6.0 (H)   Sodium (mmol/L)  Date Value  05/19/2022 140  04/06/2014 140   Assessment: 36 yo M  presenting to [facility] ED via EMS from jail on 05/18/2022 with complaints of respiratory distress. Pharmacy is asked to follow and replace electrolytes while in CCU   Goal of Therapy:  Electrolytes WNL  Plan:  --no electrolyte replacement warranted for today --recheck electrolytes in am  [name] ,PharmD Clinical Pharmacist 05/19/2022 11:27 AM

## 2022-05-19 NOTE — Progress Notes (Signed)
NAME:  CAYMAN KIELBASA, MRN:  [id number], DOB:  1985-10-14, LOS: 1 ADMISSION DATE:  05/18/2022, CONSULTATION DATE:  05/18/22 REFERRING MD:  Dr. Fanny Bien, CHIEF COMPLAINT: Respiratory Distress   History of Present Illness:  36 yo M  presenting to [facility] ED via EMS from jail on 05/18/2022 with complaints of respiratory distress. History obtained from officer bedside and ED report. Patient has been jailed these last 3 months, and while he has required albuterol treatments during previous incarcerations he has not needed any medication during this incarceration. Per officer bedside, he had no recent complaints of any kind and as far as they know he was not around any sick contacts. Earlier on 10/11 the patient became short of breath requesting an albuterol treatment, but the jail did not have any medication available at that time. EMS arrived and reportedly found the patient cyanotic in severe distress. He received a 3 albuterol & 2 duoneb nebulizer treatments, solu medrol 125 mg and was placed on CPAP support en route.   ED course: Upon arrival patient in distress and cyanotic. Afebrile, tachypneic & tachycardic but BP stable. Placed on BIPAP support with additional continuous nebulizer treatment provided. Patient initially looking more comfortable on BIPAP, but quickly decompensated. He became diaphoretic with increased WOB and the decision was made to emergently intubate him and place him on mechanical ventilatory support. Lab work revealed very mild leukocytosis, otherwise unremarkable. Medications given: 10 mg albuterol, 2 g Mg, ketamine, fentanyl, versed, vecuronium, 1 L NS bolus & succinylcholine. Propofol drip started. Initial Vitals: 98.1, 26, 123, 149/103 & SpO2 98% on BIPAP Significant labs: (Labs/ Imaging personally reviewed) I, Cheryll Cockayne Rust-Chester, AGACNP-BC, personally viewed and interpreted this ECG. EKG Interpretation: Date: 05/18/22, EKG Time: 19:28, Rate: 131,  Narrative  Interpretation: challenging interpretation due to artifact- will repeat Chemistry: Na+: 139, K+: 4.1, BUN/Cr.: 10/0.73, Serum CO2/ AG: 22/10 Hematology: WBC: 10.9, Hgb: 16.9,  PCT: pending, COVID-19 & Influenza A/B: pending, Viral respiratory panel: pending  ABG: 7.35/44/283/24.3 CXR 05/18/22: no acute cardiopulmonary disease  PCCM consulted for admission due to acute respiratory failure secondary to suspected asthma exacerbation requiring mechanical ventilatory support.  Pertinent  Medical History  Asthma Polysubstance abuse (patient previously positive for amphetamines) Tobacco Use (smoking 0.5 pack a day in 2021) Significant Hospital Events: Including procedures, antibiotic start and stop dates in addition to other pertinent events   05/18/22: Admit to ICU due to acute respiratory failure secondary to suspected asthma exacerbation requiring mechanical ventilatory support. 05/19/22- patient extubated doing well of on rooom air.   Interim History / Subjective:  Patient sedated on ventilator support, with officer bedside.  Objective   Blood pressure 109/72, pulse (!) 115, temperature 99.5 F (37.5 C), temperature source Axillary, resp. rate 15, height 6' (1.829 m), weight 76.7 kg, SpO2 96 %.    Vent Mode: PSV;Spontaneous FiO2 (%):  [35 %-60 %] 35 % Set Rate:  [12 bmp] 12 bmp Vt Set:  [450 mL] 450 mL PEEP:  [5 cmH20] 5 cmH20 Pressure Support:  [5 cmH20] 5 cmH20 Plateau Pressure:  [16 cmH20] 16 cmH20   Intake/Output Summary (Last 24 hours) at 05/19/2022 6283 Last data filed at 05/19/2022 0600 Gross per 24 hour  Intake 1472.25 ml  Output 130 ml  Net 1342.25 ml    Filed Weights   05/18/22 1932 05/19/22 0430  Weight: 74 kg 76.7 kg    Examination: General: Adult male, extubated in no distress HEENT: MM pink/moist, anicteric, atraumatic, neck supple Neuro: sedated, unable to  follow commands, PERRL +3, MAE CV: s1s2 RRR, NSR on monitor, no r/m/g Pulm: CTAB GI: soft,  rounded, non tender, bs x 4 GU: foley in place with clear yellow urine Skin: limited exam-no rashes/lesions noted Extremities: warm/dry, pulses + 2 R/P, no edema noted -Neruro - no FND grossly   Resolved Hospital Problem list     Assessment & Plan:  Acute Respiratory failure?       - no evidence of hypoxemia per EMR      -patient extubated this am    Polysubstance Abuse history Unclear if patient is currently using any substances at this time. - UDS- benzo possibly given in ER - supportive care  Best Practice (right click and "Reselect all SmartList Selections" daily)  Diet/type: NPO w/ meds via tube DVT prophylaxis: LMWH GI prophylaxis: PPI Lines: N/A Foley:  Yes, and it is still needed Code Status:  full code Last date of multidisciplinary goals of care discussion [05/18/22]  Labs   CBC: Recent Labs  Lab 05/18/22 1930 05/19/22 0858  WBC 10.9* 11.9*  HGB 16.9 14.0  HCT 48.9 41.8  MCV 87.6 90.1  PLT 231 199     Basic Metabolic Panel: Recent Labs  Lab 05/18/22 1930  NA 139  K 4.1  CL 107  CO2 22  GLUCOSE 128*  BUN 10  CREATININE 0.73  CALCIUM 9.2    GFR: Estimated Creatinine Clearance: 137.2 mL/min (by C-G formula based on SCr of 0.73 mg/dL). Recent Labs  Lab 05/18/22 1930 05/19/22 0858  PROCALCITON <0.10  --   WBC 10.9* 11.9*     Liver Function Tests: Recent Labs  Lab 05/18/22 1930  AST 27  ALT 24  ALKPHOS 62  BILITOT 0.6  PROT 7.5  ALBUMIN 4.5   No results for input(s): "LIPASE", "AMYLASE" in the last 168 hours. No results for input(s): "AMMONIA" in the last 168 hours.  ABG    Component Value Date/Time   PHART 7.35 05/18/2022 2213   PCO2ART 44 05/18/2022 2213   PO2ART 283 (H) 05/18/2022 2213   HCO3 24.3 05/18/2022 2213   ACIDBASEDEF 1.5 05/18/2022 2213   O2SAT 100 05/18/2022 2213     Coagulation Profile: No results for input(s): "INR", "PROTIME" in the last 168 hours.  Cardiac Enzymes: No results for input(s):  "CKTOTAL", "CKMB", "CKMBINDEX", "TROPONINI" in the last 168 hours.  HbA1C: No results found for: "HGBA1C"  CBG: Recent Labs  Lab 05/18/22 2351  GLUCAP 142*    Review of Systems:   UTA- patient intubated and sedated.  Past Medical History:  He,  has a past medical history of Asthma, GERD (gastroesophageal reflux disease), and Substance abuse ([place]).   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 He has been smoking an average of .5 packs per day. He has never used smokeless tobacco. He reports that he does not currently use drugs after having used the following drugs: Heroin. He reports that he does not drink alcohol.   Family History:  His family history is not on file.   Allergies No Known Allergies   Home Medications  Prior to Admission medications   Medication Sig Start Date End Date Taking? Authorizing Provider  acetaminophen (TYLENOL) 500 MG tablet Take 2 tablets (1,000 mg total) by mouth every 6 (six) hours as needed for mild pain, moderate pain, fever or headache. 09/25/20   Duanne Guess, MD  albuterol (VENTOLIN HFA) 108 (90 Base) MCG/ACT inhaler Inhale 2 puffs into the lungs  every 6 (six) hours as needed for wheezing or shortness of breath.    [provider]     Critical care provider statement:   Total critical care time: 33 minutes   Performed by: Karna Christmas MD   Critical care time was exclusive of separately billable procedures and treating other patients.   Critical care was necessary to treat or prevent imminent or life-threatening deterioration.   Critical care was time spent personally by me on the following activities: development of treatment plan with patient and/or surrogate as well as nursing, discussions with consultants, evaluation of patient's response to treatment, examination of patient, obtaining history from patient or surrogate, ordering and performing treatments and interventions, ordering and review of laboratory studies, ordering and review of radiographic studies, pulse oximetry and re-evaluation of patient's condition.    Vida Rigger, M.D.  Pulmonary & Critical Care Medicine

## 2022-05-20 DIAGNOSIS — J45901 Unspecified asthma with (acute) exacerbation: Secondary | ICD-10-CM | POA: Diagnosis not present

## 2022-05-20 LAB — CBC
HCT: 37.9 % — ABNORMAL LOW (ref 39.0–52.0)
Hemoglobin: 12.9 g/dL — ABNORMAL LOW (ref 13.0–17.0)
MCH: 30.1 pg (ref 26.0–34.0)
MCHC: 34 g/dL (ref 30.0–36.0)
MCV: 88.6 fL (ref 80.0–100.0)
Platelets: 173 10*3/uL (ref 150–400)
RBC: 4.28 MIL/uL (ref 4.22–5.81)
RDW: 12.8 % (ref 11.5–15.5)
WBC: 9.4 10*3/uL (ref 4.0–10.5)
nRBC: 0 % (ref 0.0–0.2)

## 2022-05-20 LAB — BASIC METABOLIC PANEL
Anion gap: 5 (ref 5–15)
BUN: 17 mg/dL (ref 6–20)
CO2: 28 mmol/L (ref 22–32)
Calcium: 8.7 mg/dL — ABNORMAL LOW (ref 8.9–10.3)
Chloride: 108 mmol/L (ref 98–111)
Creatinine, Ser: 0.81 mg/dL (ref 0.61–1.24)
GFR, Estimated: >60 mL/min (ref >60)
Glucose, Bld: 91 mg/dL (ref 70–99)
Potassium: 3.9 mmol/L (ref 3.5–5.1)
Sodium: 141 mmol/L (ref 135–145)

## 2022-05-20 LAB — MAGNESIUM: Magnesium: 1.9 mg/dL (ref 1.7–2.4)

## 2022-05-20 LAB — PROCALCITONIN: Procalcitonin: <0.1 ng/mL

## 2022-05-20 LAB — HIV ANTIBODY (ROUTINE TESTING W REFLEX): HIV Screen 4th Generation wRfx: NONREACTIVE

## 2022-05-20 LAB — PHOSPHORUS: Phosphorus: 2.9 mg/dL (ref 2.5–4.6)

## 2022-05-20 MED ORDER — FLUTICASONE-SALMETEROL 250-50 MCG/ACT IN AEPB: 1.0000 | INHALATION_SPRAY | Freq: Two times a day (BID) | RESPIRATORY_TRACT | 2 refills | Status: AC | PRN

## 2022-05-20 MED ORDER — PREDNISONE 10 MG PO TABS: 40.0000 mg | ORAL_TABLET | Freq: Every day | ORAL | 0 refills | Status: AC

## 2022-05-20 NOTE — Progress Notes (Signed)
Patient continues under police custody with officer at bedside, handcuff to right ankle that is managed by officer.

## 2022-05-20 NOTE — Discharge Summary (Signed)
Physician Discharge Summary   [name]  male DOB: Jan 06, 1986  IRS:[id number]  PCP: Patient, No Pcp Per  Admit date: 05/18/2022 Discharge date: 05/20/2022  Admitted From: correctional facility Disposition:  correctional facility CODE STATUS: Full code  Discharge Instructions     Diet general   Complete by: As directed       Hospital Course:  For full details, please see H&P, progress notes, consult notes and ancillary notes.  Briefly,  [name] is a 36 yo M with hx of asthma who presented to [facility] ED via EMS from jail on 05/18/2022 with complaints of respiratory distress.   Patient has been jailed these last 3 months, and while he had required albuterol treatments during previous incarcerations he had not needed any medication during this incarceration. Per officer bedside, he had no recent complaints of any kind and as far as they know he was not around any sick contacts.  Earlier on 10/11 the patient became short of breath requesting an albuterol treatment, but the jail did not have any medication available at that time. EMS arrived and reportedly found the patient cyanotic in severe distress. He received a 3 albuterol & 2 duoneb nebulizer treatments, solu medrol 125 mg and was placed on CPAP support en route.   On arrival, pt was placed on BIPAP support with additional continuous nebulizer treatment provided.  Pt quickly decompensated, became diaphoretic with increased WOB and the decision was made to emergently intubate him.  Pt was extubated on 05/19/22 and doing well on room air.  Acute respiratory distress 2/2  Asthma exacerbation Pt received steroid and Nebs and was at his baseline respiratory status prior to discharge. --pt was discharged on 3 more days of prednisone 40 mg daily, and Advair PRN.   Discharge Diagnoses:  Principal Problem:   Acute respiratory failure (HCC)   30 Day Unplanned Readmission Risk Score    Flowsheet Row ED to  Hosp-Admission (Current) from 05/18/2022 in [facility] ICU/CCU  30 Day Unplanned Readmission Risk Score (%) 11.71 Filed at 05/20/2022 0801       This score is the patient's risk of an unplanned readmission within 30 days of being discharged (0 -100%). The score is based on dignosis, age, lab data, medications, orders, and past utilization.   Low:  0-14.9   Medium: 15-21.9   High: 22-29.9   Extreme: 30 and above         Discharge Instructions:  Allergies as of 05/20/2022   No Known Allergies      Medication List     TAKE these medications    acetaminophen 500 MG tablet Commonly known as: TYLENOL Take 2 tablets (1,000 mg total) by mouth every 6 (six) hours as needed for mild pain, moderate pain, fever or headache.   albuterol 108 (90 Base) MCG/ACT inhaler Commonly known as: VENTOLIN HFA Inhale 2 puffs into the lungs every 6 (six) hours as needed for wheezing or shortness of breath.   fluticasone-salmeterol 250-50 MCG/ACT Aepb Commonly known as: ADVAIR Inhale 1 puff into the lungs 2 (two) times daily as needed.   predniSONE 10 MG tablet Commonly known as: DELTASONE Take 4 tablets (40 mg total) by mouth daily for 3 days. Start taking on: May 21, 2022          No Known Allergies   The results of significant diagnostics from this hospitalization (including imaging, microbiology, ancillary and laboratory) are listed below for reference.   Consultations:  Procedures/Studies: CT Angio Chest Pulmonary Embolism (PE) W or WO Contrast  Result Date: 05/19/2022 CLINICAL DATA:  Respiratory distress. High probability for acute pulmonary embolus. EXAM: CT ANGIOGRAPHY CHEST WITH CONTRAST TECHNIQUE: Multidetector CT imaging of the chest was performed using the standard protocol during bolus administration of intravenous contrast. Multiplanar CT image reconstructions and MIPs were obtained to evaluate the vascular anatomy. RADIATION DOSE REDUCTION:  This exam was performed according to the departmental dose-optimization program which includes automated exposure c29522Theresia MajoWashiIona29Theresia MajoWashiIona [id number] [id number] MajoWashiIona BeMiss2Theres295Theresia MajoWa29Theresia MajoWashiIona [id number] MajoWashiIona295Theresia MajoWashiIona BeAk[facility] TommBKor2Theresia MajoWashiIona [id number] 2Theresia MajoWashiIona BeHealthsouth Rehabilitation Hospital Of JonTommBKorealake Divineead02ia MajoWashiIona BePasteur Plaza Surgery CenTommBKorea71mHialeah [place]13.2 l is mildly diminished secondary to respiratory motion artifact satisfactory opacification of the pulmonary arteries to the segmental level. No evidence of pulmonary embolism. Normal heart size. No pericardial effusion. Mediastinum/Nodes: No enlarged mediastinal, hilar, or axillary lymph nodes. Thyroid gland, trachea, and esophagus demonstrate no significant findings. Lungs/Pleura: No pleural effusion identified. No airspace consolidation. There is mild subpleural atelectasis bilateral posterior lung bases. Small subpleural collection of gas overlying the posteromedial right base measuring 1.5 x 0.8 cm, image 78/6. A similar finding is noted on the contralateral posterior left base, image 80/6. Right middle lobe lung nodule measures 4 mm, image 55/6. Also in the right middle lobe is a 3 mm lung nodule, image 48/6. A similar appearance is noted on the contralateral side Upper Abdomen: No acute abnormality. Musculoskeletal: No chest wall abnormality. No acute or significant osseous findings. 1. No signs Review of the MIP images confirms the above findings. IMPRESSION: 1. No evidence for acute pulmonary embolism. 2. Mild subpleural atelectasis overlies both lung bases. 3. Small subpleural collection of gas overlying the posteromedial right base measuring 1.5 x 0.8 cm. This is favored to represent an emphysematous airspace surrounded by atelectasis. This may be from remote inflammation or infection. 4. Small right middle lobe lung nodules measuring up to 4 mm. Per Fleischner Society Guidelines, no routine follow-up imaging is recommended. Electronically Signed   By: Taylor  Stroud  M.D.   On: 05/19/2022 16:39   DG Chest Portable 1 View  Result Date: 05/18/2022 CLINICAL DATA:  OG tube advancement EXAM: PORTABLE CHEST 1 VIEW COMPARISON:  05/18/2022 FINDINGS: Enteric tube was advanced with tip now projecting over the left upper quadrant consistent with location in the body of the stomach. Proximal side hole now projects well below the expected level of the EG junction. Endotracheal tube tip measures 4.6 cm above the carina. Heart size and pulmonary vascularity are normal. Lungs are clear. IMPRESSION: Appliances appear in satisfactory position.  Lungs are clear. Electronically Signed   By: William  Stevens M.D.   On: 05/18/2022 20:57   DG Chest Portable 1 View  Result Date: 05/18/2022 CLINICAL DATA:  Intubation and OG tube placement. Respiratory distress. Asthma and current smoker. EXAM: PORTABLE CHEST 1 VIEW COMPARISON:  05/18/2022 FINDINGS: Endotracheal tube placed with tip measuring 6.2 cm above the carina. Enteric tube placed with tip over the left upper quadrant consistent with location in the upper stomach. Proximal side hole projects at or just above the level of the EG junction. Heart size and pulmonary vascularity are normal. Lungs are clear. No pleural effusions. No pneumothorax. Mediastinal contours appear intact. IMPRESSION: 1. Endotracheal tube appears in satisfactory position. Enteric tube tip projects over the expected location of the upper stomach but proximal side hole projects over the distal EG junction. Advancement of a couple cm is suggested. 2.  Lungs are clear.  Electronically Signed   By: Lucienne Capers M.D.   On: 05/18/2022 20:57   DG Chest Portable 1 View  Result Date: 05/18/2022 CLINICAL DATA:  Respiratory distress EXAM: PORTABLE CHEST 1 VIEW COMPARISON:  Chest x-ray 06/15/2020 FINDINGS: The heart size and mediastinal contours are within normal limits. Both lungs are clear. The visualized skeletal structures are unremarkable. IMPRESSION: No active  disease. Electronically Signed   By: Ronney Asters M.D.   On: 05/18/2022 19:59      Labs: BNP (last 3 results) No results for input(s): "BNP" in the last 8760 hours. Basic Metabolic Panel: Recent Labs  Lab 05/18/22 1930 05/19/22 0858 05/20/22 0504  NA 139 140 141  K 4.1 4.3 3.9  CL 107 109 108  CO2 22 19* 28  GLUCOSE 128* 140* 91  BUN 10 20 17   CREATININE 0.73 1.85* 0.81  CALCIUM 9.2 8.7* 8.7*  MG  --  2.1 1.9  PHOS  --  6.0* 2.9   Liver Function Tests: Recent Labs  Lab 05/18/22 1930  AST 27  ALT 24  ALKPHOS 62  BILITOT 0.6  PROT 7.5  ALBUMIN 4.5   No results for input(s): "LIPASE", "AMYLASE" in the last 168 hours. No results for input(s): "AMMONIA" in the last 168 hours. CBC: Recent Labs  Lab 05/18/22 1930 05/19/22 0858 05/20/22 0504  WBC 10.9* 11.9* 9.4  HGB 16.9 14.0 12.9*  HCT 48.9 41.8 37.9*  MCV 87.6 90.1 88.6  PLT 231 199 173   Cardiac Enzymes: No results for input(s): "CKTOTAL", "CKMB", "CKMBINDEX", "TROPONINI" in the last 168 hours. BNP: Invalid input(s): "POCBNP" CBG: Recent Labs  Lab 05/18/22 2351  GLUCAP 142*   D-Dimer No results for input(s): "DDIMER" in the last 72 hours. Hgb A1c No results for input(s): "HGBA1C" in the last 72 hours. Lipid Profile No results for input(s): "CHOL", "HDL", "LDLCALC", "TRIG", "CHOLHDL", "LDLDIRECT" in the last 72 hours. Thyroid function studies No results for input(s): "TSH", "T4TOTAL", "T3FREE", "THYROIDAB" in the last 72 hours.  Invalid input(s): "FREET3" Anemia work up No results for input(s): "VITAMINB12", "FOLATE", "FERRITIN", "TIBC", "IRON", "RETICCTPCT" in the last 72 hours. Urinalysis    Component Value Date/Time   COLORURINE YELLOW (A) 05/19/2022 0305   APPEARANCEUR CLEAR (A) 05/19/2022 0305   APPEARANCEUR Hazy 03/19/2012 2021   LABSPEC 1.019 05/19/2022 0305   LABSPEC 1.027 03/19/2012 2021   PHURINE 5.0 05/19/2022 0305   GLUCOSEU NEGATIVE 05/19/2022 0305   GLUCOSEU Negative  03/19/2012 2021   HGBUR NEGATIVE 05/19/2022 0305   BILIRUBINUR NEGATIVE 05/19/2022 0305   BILIRUBINUR Negative 03/19/2012 2021   KETONESUR NEGATIVE 05/19/2022 0305   PROTEINUR NEGATIVE 05/19/2022 0305   UROBILINOGEN 1.0 05/12/2008 2054   NITRITE NEGATIVE 05/19/2022 0305   LEUKOCYTESUR NEGATIVE 05/19/2022 0305   LEUKOCYTESUR Negative 03/19/2012 2021   Sepsis Labs Recent Labs  Lab 05/18/22 1930 05/19/22 0858 05/20/22 0504  WBC 10.9* 11.9* 9.4   Microbiology Recent Results (from the past 240 hour(s))  MRSA Next Gen by PCR, Nasal     Status: Abnormal   Collection Time: 05/19/22 12:30 AM   Specimen: Nasal Mucosa; Nasal Swab  Result Value Ref Range Status   MRSA by PCR Next Gen DETECTED (A) NOT DETECTED Final    Comment: CRITICAL RESULT CALLED TO, READ BACK BY AND VERIFIED WITH: IVAN SCRVINER @ 6222 05/19/22 LFD (NOTE) The GeneXpert MRSA Assay (FDA approved for NASAL specimens only), is one component of a comprehensive MRSA colonization surveillance program. It is not intended to diagnose MRSA  infection nor to guide or monitor treatment for MRSA infections. Test performance is not FDA approved in patients less than 77 years old. Performed at [facility], [address] [place], [place] [address]   Resp Panel by RT-PCR (Flu A&B, Covid) Anterior Nasal Swab     Status: None   Collection Time: 05/19/22  1:42 AM   Specimen: Anterior Nasal Swab  Result Value Ref Range Status   SARS Coronavirus 2 by RT PCR NEGATIVE NEGATIVE Final    Comment: (NOTE) SARS-CoV-2 target nucleic acids are NOT DETECTED.  The SARS-CoV-2 RNA is generally detectable in upper respiratory specimens during the acute phase of infection. The lowest concentration of SARS-CoV-2 viral copies this assay can detect is 138 copies/mL. A negative result does not preclude SARS-Cov-2 infection and should not be used as the sole basis for treatment or other patient management decisions. A negative result may  occur with  improper specimen collection/handling, submission of specimen other than nasopharyngeal swab, presence of viral mutation(s) within the areas targeted by this assay, and inadequate number of viral copies(<138 copies/mL). A negative result must be combined with clinical observations, patient history, and epidemiological information. The expected result is Negative.  Fact Sheet for Patients:  BloggerCourse.com  Fact Sheet for Healthcare Providers:  SeriousBroker.it  This test is no t yet approved or cleared by the [place] FDA and  has been authorized for detection and/or diagnosis of SARS-CoV-2 by FDA under an Emergency Use Authorization (EUA). This EUA will remain  in effect (meaning this test can be used) for the duration of the COVID-19 declaration under Section 564(b)(1) of the Act, 21 U.S.C.section 360bbb-3(b)(1), unless the authorization is terminated  or revoked sooner.       Influenza A by PCR NEGATIVE NEGATIVE Final   Influenza B by PCR NEGATIVE NEGATIVE Final    Comment: (NOTE) The Xpert Xpress SARS-CoV-2/FLU/RSV plus assay is intended as an aid in the diagnosis of influenza from Nasopharyngeal swab specimens and should not be used as a sole basis for treatment. Nasal washings and aspirates are unacceptable for Xpert Xpress SARS-CoV-2/FLU/RSV testing.  Fact Sheet for Patients: BloggerCourse.com  Fact Sheet for Healthcare Providers: SeriousBroker.it  This test is not yet approved or cleared by the [place] FDA and has been authorized for detection and/or diagnosis of SARS-CoV-2 by FDA under an Emergency Use Authorization (EUA). This EUA will remain in effect (meaning this test can be used) for the duration of the COVID-19 declaration under Section 564(b)(1) of the Act, 21 U.S.C. section 360bbb-3(b)(1), unless the authorization is terminated  or revoked.  Performed at [facility], [address] [place], [place] [address]   Respiratory (~20 pathogens) panel by PCR     Status: None   Collection Time: 05/19/22  1:42 AM   Specimen: Nasopharyngeal Swab; Respiratory  Result Value Ref Range Status   Adenovirus NOT DETECTED NOT DETECTED Final   Coronavirus 229E NOT DETECTED NOT DETECTED Final    Comment: (NOTE) The Coronavirus on the Respiratory Panel, DOES NOT test for the novel  Coronavirus (2019 nCoV)    Coronavirus HKU1 NOT DETECTED NOT DETECTED Final   Coronavirus NL63 NOT DETECTED NOT DETECTED Final   Coronavirus OC43 NOT DETECTED NOT DETECTED Final   Metapneumovirus NOT DETECTED NOT DETECTED Final   Rhinovirus / Enterovirus NOT DETECTED NOT DETECTED Final   Influenza A NOT DETECTED NOT DETECTED Final   Influenza B NOT DETECTED NOT DETECTED Final   Parainfluenza Virus 1 NOT DETECTED  NOT DETECTED Final   Parainfluenza Virus 2 NOT DETECTED NOT DETECTED Final   Parainfluenza Virus 3 NOT DETECTED NOT DETECTED Final   Parainfluenza Virus 4 NOT DETECTED NOT DETECTED Final   Respiratory Syncytial Virus NOT DETECTED NOT DETECTED Final   Bordetella pertussis NOT DETECTED NOT DETECTED Final   Bordetella Parapertussis NOT DETECTED NOT DETECTED Final   Chlamydophila pneumoniae NOT DETECTED NOT DETECTED Final   Mycoplasma pneumoniae NOT DETECTED NOT DETECTED Final    Comment: Performed at [facility], [address], [place], [place] [address]     Total time spend on discharging this patient, including the last patient exam, discussing the hospital stay, instructions for ongoing care as it relates to all pertinent caregivers, as well as preparing the medical discharge records, prescriptions, and/or referrals as applicable, is 45 minutes.    Darlin Priestly, MD  [facility] 05/20/2022, 10:45 AM

## 2022-05-20 NOTE — Plan of Care (Signed)
Patient discharged to correctional facility with police escort.

## 2022-05-20 NOTE — Progress Notes (Signed)
[place] for Electrolyte Monitoring and Replacement   Recent Labs: Potassium (mmol/L)  Date Value  05/20/2022 3.9  04/06/2014 4.1   Magnesium (mg/dL)  Date Value  05/20/2022 1.9   Calcium (mg/dL)  Date Value  05/20/2022 8.7 (L)   Calcium, Total (mg/dL)  Date Value  04/06/2014 8.7   Albumin (g/dL)  Date Value  05/18/2022 4.5  04/06/2014 4.2   Phosphorus (mg/dL)  Date Value  05/20/2022 2.9   Sodium (mmol/L)  Date Value  05/20/2022 141  04/06/2014 140   Assessment: 36 yo M  [facility] ED [facility] 05/18/2022 with complaints of respiratory distress. Pharmacy is asked to follow and replace electrolytes while in CCU   Goal of Therapy:  Electrolytes WNL  Plan:  Scr 1.85>0.81; UOP 1.1 mL/k/h after significant renal recovery Phos 6>2.9 - resolved --no electrolyte replacement warranted for today --recheck electrolytes in AM  Lorna Dibble ,PharmD Clinical Pharmacist 05/20/2022 8:50 AM

## 2022-05-20 NOTE — Progress Notes (Signed)
At approximately 1300 report given to nurse at [facility] office. Patient is discharged in stable condition and escorted by police custody in wheelchair on discharge.

## 2022-05-20 NOTE — Progress Notes (Signed)
[facility] office to give report to nurse at facility, voicemail left for a return call as patient has been discharged.

## 2022-05-20 NOTE — Plan of Care (Signed)
Continuing with plan of care. 

## 2022-05-25 LAB — BLOOD GAS, ARTERIAL
Acid-base deficit: 1.5 mmol/L (ref 0.0–2.0)
Bicarbonate: 24.3 mmol/L (ref 20.0–28.0)
FIO2: 60 %
MECHVT: 450 mL
O2 Saturation: 100 %
PEEP: 5 cmH2O
Patient temperature: 37
RATE: 12 resp/min
pCO2 arterial: 44 mmHg (ref 32–48)
pH, Arterial: 7.35 (ref 7.35–7.45)
pO2, Arterial: 283 mmHg — ABNORMAL HIGH (ref 83–108)

## 2022-10-02 IMAGING — US US PELVIS LIMITED
1 series · 14 of 21 positions shown · non-contrast
Comparison: None.

CLINICAL DATA: 34-year-old with a right groin mass.

EXAM:
LIMITED ULTRASOUND OF PELVIS
TECHNIQUE: Limited transabdominal ultrasound examination of the pelvis was
performed.

[Series 1: us limited joint space structures low right · 21 acquisitions, 14 frames shown]
[im 1/21]
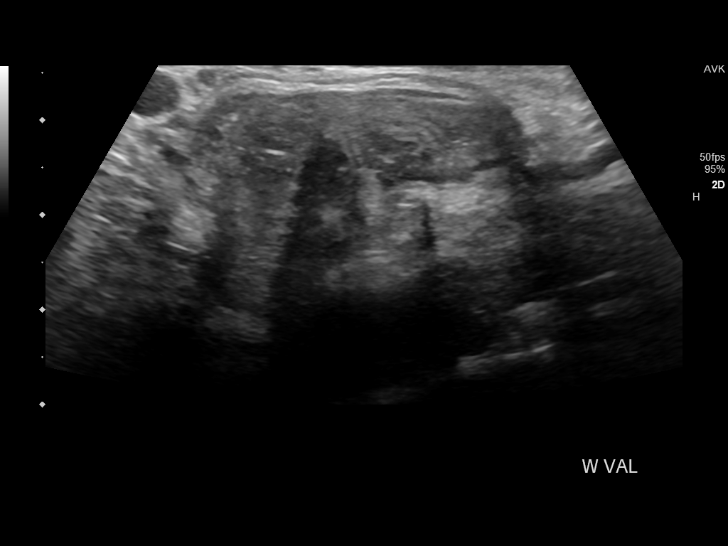
[im 3/21]
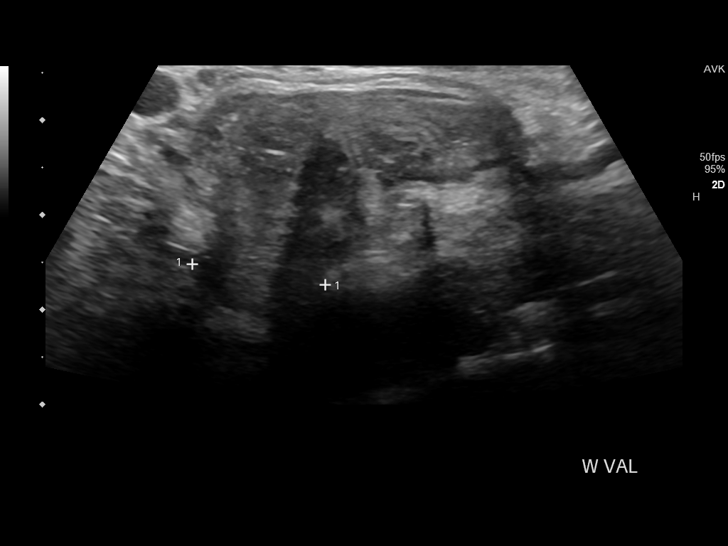
[im 4/21]
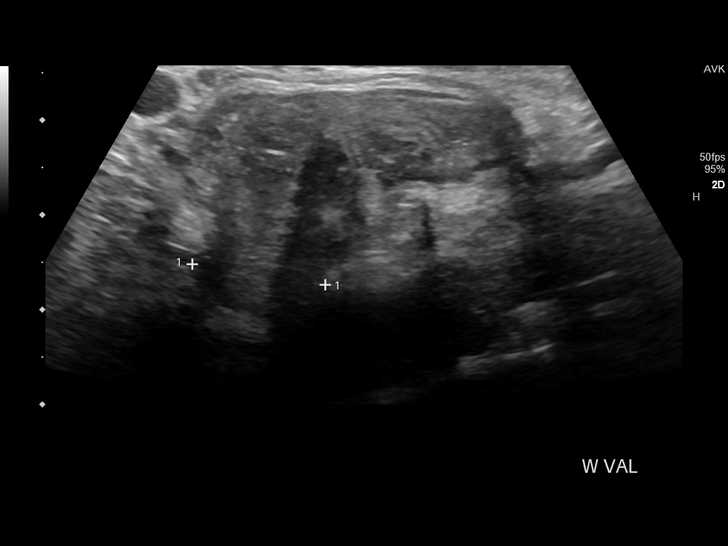
[im 6/21]
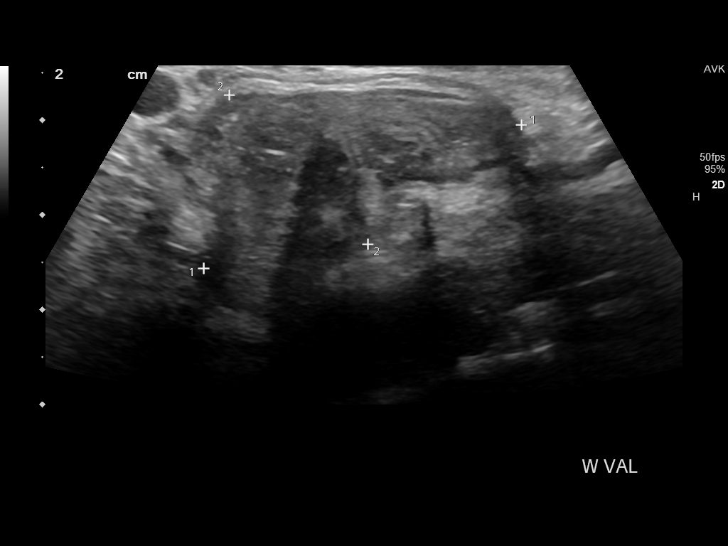
[im 7/21]
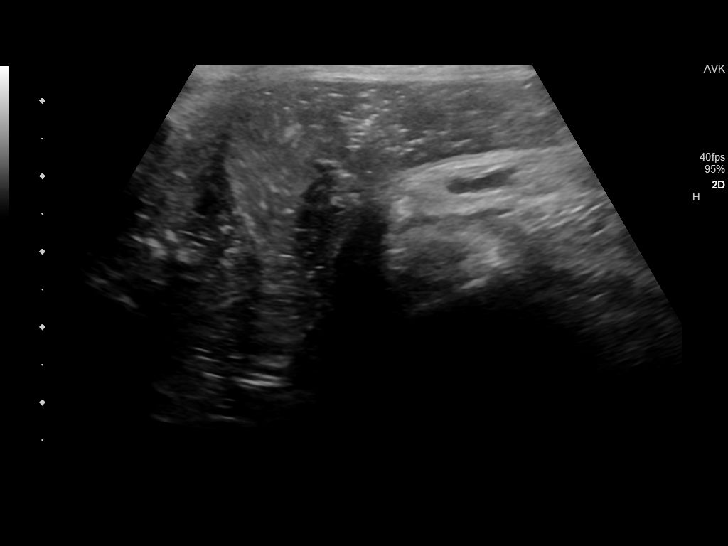
[im 9/21]
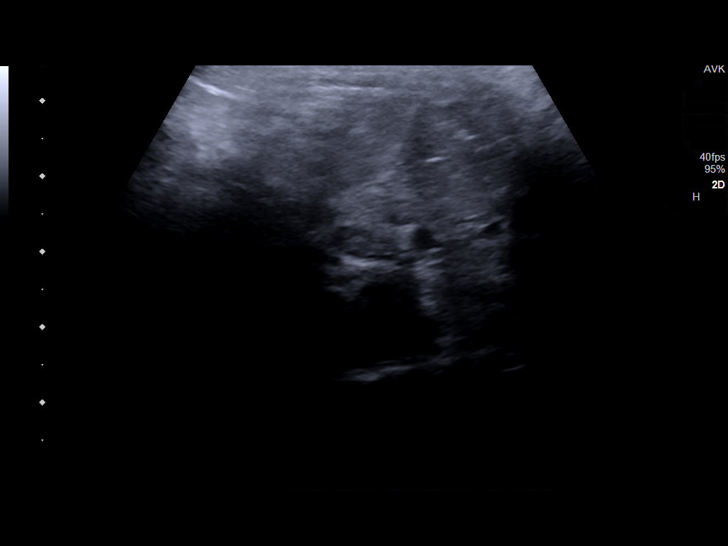
[im 10/21]
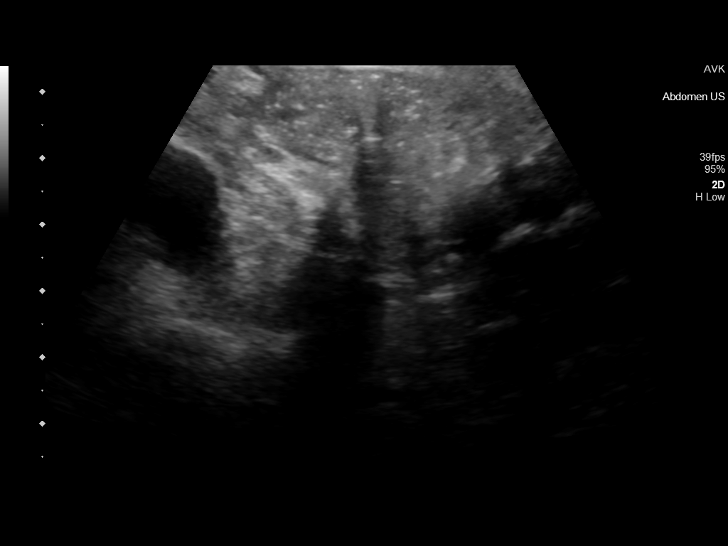
[im 12/21]
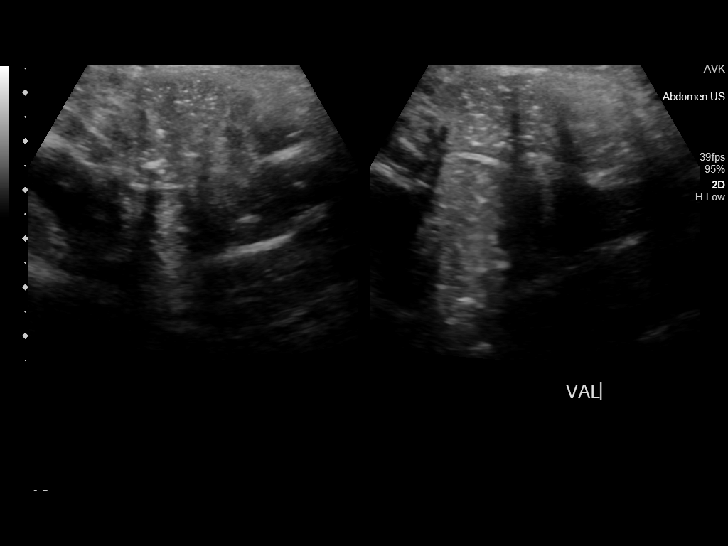
[im 13/21]
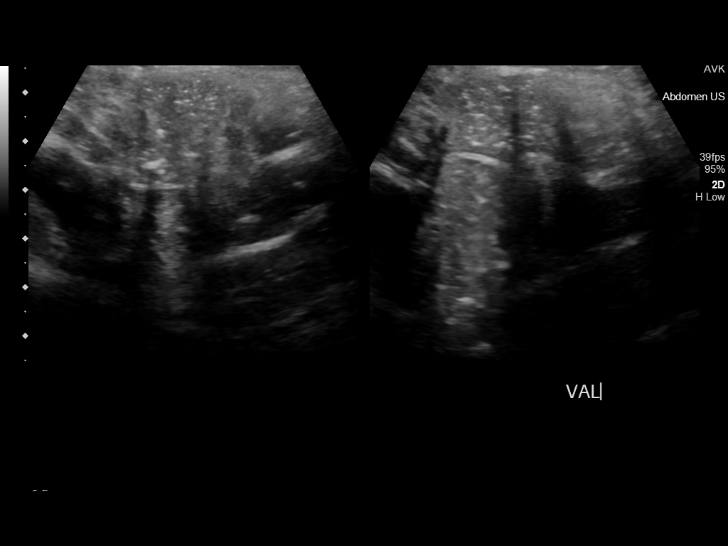
[im 15/21]
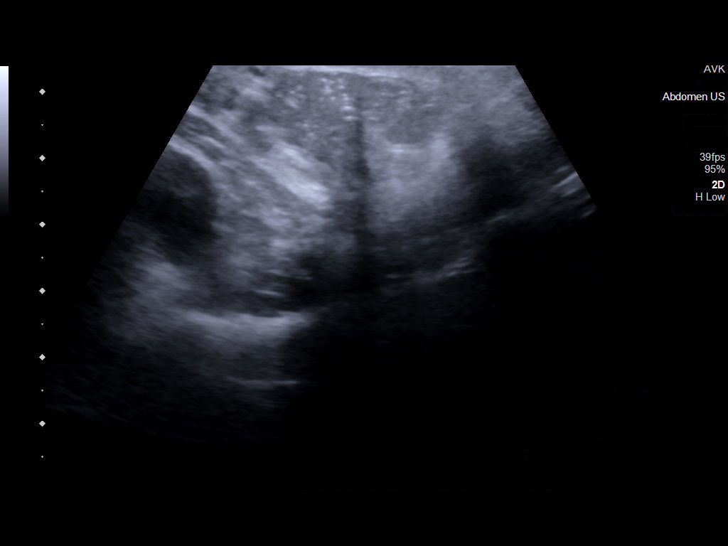
[im 16/21]
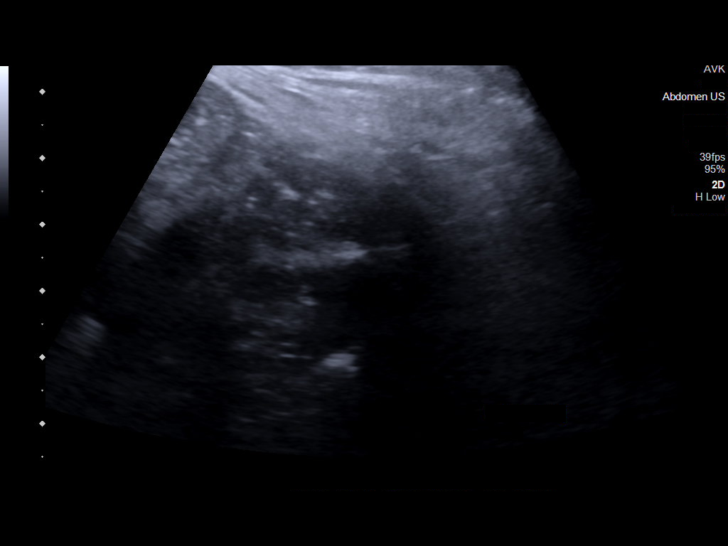
[im 18/21]
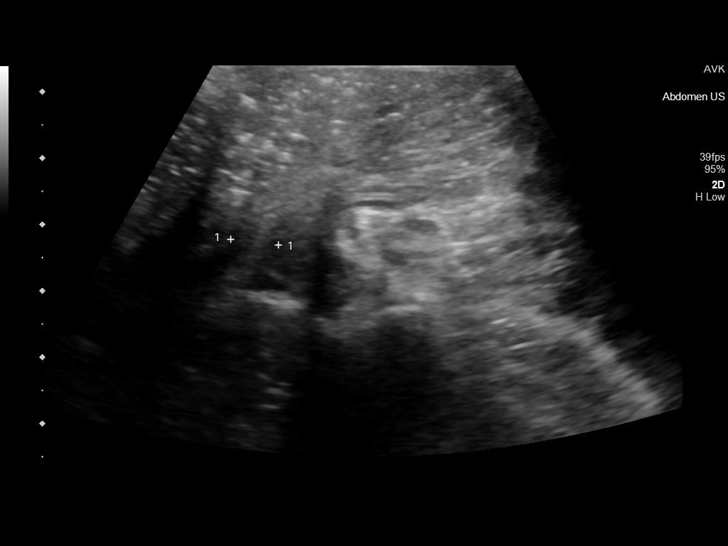
[im 19/21]
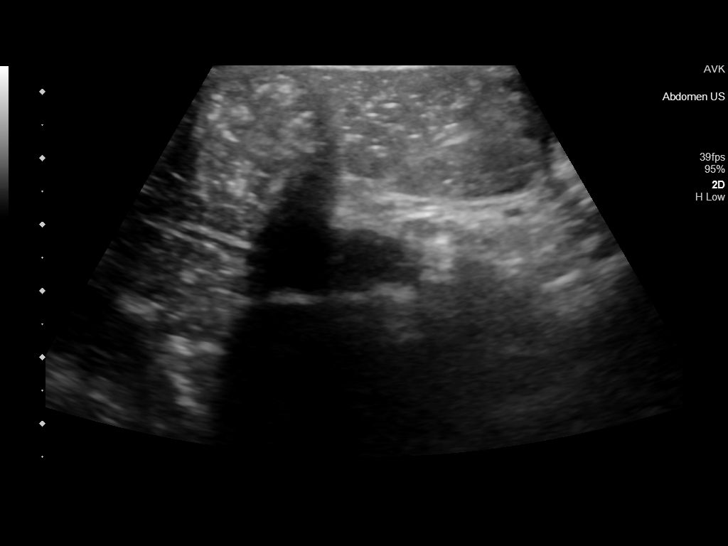
[im 21/21]
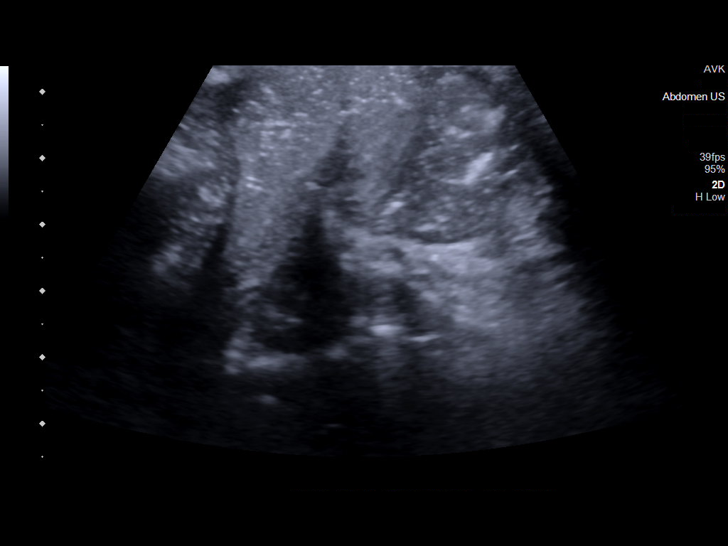

[14 of 21 positions shown; findings below may reference images not displayed]

FINDINGS: Images were obtained over the palpable area in the right groin.
Evidence for hernia at the area of concern. This hernia sac roughly
measures 3.7 x 2.2 cm. Structures moving within the hernia sac are
most compatible with bowel. There is trace fluid within this hernia
sac.
IMPRESSION: The palpable area in the right groin appears to represent a hernia
sac containing bowel. There may be trace fluid within the sac. This
area could be further characterized with CT if needed.

## 2024-09-02 ENCOUNTER — Other Ambulatory Visit: Payer: Self-pay | Admitting: Medical Genetics

## 2024-09-04 ENCOUNTER — Other Ambulatory Visit: Payer: Self-pay | Admitting: Medical Genetics

## 2024-09-04 DIAGNOSIS — Z006 Encounter for examination for normal comparison and control in clinical research program: Secondary | ICD-10-CM
